# Patient Record
Sex: Male | Born: 1945 | Race: White | Hispanic: No | Marital: Married | Smoking: Never smoker
Health system: Southern US, Community
[De-identification: ages and names within clinical notes are randomized; demographics above are authoritative.]

## PROBLEM LIST (undated history)

## (undated) DIAGNOSIS — C61 Malignant neoplasm of prostate: Secondary | ICD-10-CM

## (undated) DIAGNOSIS — E785 Hyperlipidemia, unspecified: Secondary | ICD-10-CM

## (undated) DIAGNOSIS — I059 Rheumatic mitral valve disease, unspecified: Secondary | ICD-10-CM

## (undated) DIAGNOSIS — I341 Nonrheumatic mitral (valve) prolapse: Secondary | ICD-10-CM

## (undated) DIAGNOSIS — K269 Duodenal ulcer, unspecified as acute or chronic, without hemorrhage or perforation: Secondary | ICD-10-CM

## (undated) DIAGNOSIS — R011 Cardiac murmur, unspecified: Secondary | ICD-10-CM

## (undated) DIAGNOSIS — C801 Malignant (primary) neoplasm, unspecified: Secondary | ICD-10-CM

## (undated) DIAGNOSIS — I10 Essential (primary) hypertension: Secondary | ICD-10-CM

## (undated) DIAGNOSIS — K649 Unspecified hemorrhoids: Secondary | ICD-10-CM

## (undated) DIAGNOSIS — M1990 Unspecified osteoarthritis, unspecified site: Secondary | ICD-10-CM

## (undated) DIAGNOSIS — J309 Allergic rhinitis, unspecified: Secondary | ICD-10-CM

## (undated) DIAGNOSIS — Z8719 Personal history of other diseases of the digestive system: Secondary | ICD-10-CM

## (undated) DIAGNOSIS — Z8711 Personal history of peptic ulcer disease: Secondary | ICD-10-CM

## (undated) DIAGNOSIS — E786 Lipoprotein deficiency: Secondary | ICD-10-CM

## (undated) DIAGNOSIS — R918 Other nonspecific abnormal finding of lung field: Secondary | ICD-10-CM

## (undated) DIAGNOSIS — K219 Gastro-esophageal reflux disease without esophagitis: Secondary | ICD-10-CM

## (undated) HISTORY — PX: CARDIAC CATHETERIZATION: SHX172

## (undated) HISTORY — PX: HERNIA REPAIR: SHX51

## (undated) HISTORY — PX: SALIVARY STONE REMOVAL: SHX5213

---
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
## 2001-04-04 ENCOUNTER — Other Ambulatory Visit: Admission: RE | Admit: 2001-04-04 | Discharge: 2001-04-04 | Payer: Self-pay | Admitting: Otolaryngology

## 2001-04-04 ENCOUNTER — Encounter (INDEPENDENT_AMBULATORY_CARE_PROVIDER_SITE_OTHER): Payer: Self-pay

## 2001-07-20 ENCOUNTER — Encounter: Payer: Self-pay | Admitting: Otolaryngology

## 2001-07-20 ENCOUNTER — Encounter: Admission: RE | Admit: 2001-07-20 | Discharge: 2001-07-20 | Payer: Self-pay | Admitting: Otolaryngology

## 2001-07-29 ENCOUNTER — Other Ambulatory Visit: Admission: RE | Admit: 2001-07-29 | Discharge: 2001-07-29 | Payer: Self-pay | Admitting: Otolaryngology

## 2001-12-02 ENCOUNTER — Ambulatory Visit (HOSPITAL_COMMUNITY): Admission: RE | Admit: 2001-12-02 | Discharge: 2001-12-02 | Payer: Self-pay | Admitting: *Deleted

## 2006-08-13 ENCOUNTER — Encounter (INDEPENDENT_AMBULATORY_CARE_PROVIDER_SITE_OTHER): Payer: Self-pay | Admitting: *Deleted

## 2006-08-13 ENCOUNTER — Ambulatory Visit (HOSPITAL_COMMUNITY): Admission: RE | Admit: 2006-08-13 | Discharge: 2006-08-13 | Payer: Self-pay | Admitting: *Deleted

## 2007-08-25 ENCOUNTER — Encounter (INDEPENDENT_AMBULATORY_CARE_PROVIDER_SITE_OTHER): Payer: Self-pay | Admitting: *Deleted

## 2007-08-25 ENCOUNTER — Ambulatory Visit (HOSPITAL_COMMUNITY): Admission: RE | Admit: 2007-08-25 | Discharge: 2007-08-25 | Payer: Self-pay | Admitting: *Deleted

## 2008-07-20 ENCOUNTER — Encounter: Payer: Self-pay | Admitting: Internal Medicine

## 2008-08-09 ENCOUNTER — Ambulatory Visit: Payer: Self-pay | Admitting: Pulmonary Disease

## 2008-08-09 DIAGNOSIS — K219 Gastro-esophageal reflux disease without esophagitis: Secondary | ICD-10-CM

## 2008-08-09 DIAGNOSIS — E786 Lipoprotein deficiency: Secondary | ICD-10-CM | POA: Insufficient documentation

## 2008-08-09 DIAGNOSIS — R05 Cough: Secondary | ICD-10-CM | POA: Insufficient documentation

## 2008-08-09 DIAGNOSIS — J309 Allergic rhinitis, unspecified: Secondary | ICD-10-CM

## 2008-08-09 DIAGNOSIS — I10 Essential (primary) hypertension: Secondary | ICD-10-CM

## 2008-08-09 DIAGNOSIS — I059 Rheumatic mitral valve disease, unspecified: Secondary | ICD-10-CM | POA: Insufficient documentation

## 2008-08-09 DIAGNOSIS — E785 Hyperlipidemia, unspecified: Secondary | ICD-10-CM

## 2008-08-09 DIAGNOSIS — R059 Cough, unspecified: Secondary | ICD-10-CM | POA: Insufficient documentation

## 2008-08-09 HISTORY — DX: Lipoprotein deficiency: E78.6

## 2008-08-09 HISTORY — DX: Essential (primary) hypertension: I10

## 2008-08-09 HISTORY — DX: Rheumatic mitral valve disease, unspecified: I05.9

## 2008-08-09 HISTORY — DX: Hyperlipidemia, unspecified: E78.5

## 2008-08-09 HISTORY — DX: Gastro-esophageal reflux disease without esophagitis: K21.9

## 2008-08-09 HISTORY — DX: Allergic rhinitis, unspecified: J30.9

## 2008-08-13 ENCOUNTER — Telehealth: Payer: Self-pay | Admitting: Pulmonary Disease

## 2008-09-06 ENCOUNTER — Ambulatory Visit: Payer: Self-pay | Admitting: Pulmonary Disease

## 2008-09-13 ENCOUNTER — Ambulatory Visit (HOSPITAL_COMMUNITY): Admission: RE | Admit: 2008-09-13 | Discharge: 2008-09-13 | Payer: Self-pay | Admitting: Pulmonary Disease

## 2008-12-14 ENCOUNTER — Telehealth: Payer: Self-pay | Admitting: Pulmonary Disease

## 2008-12-25 ENCOUNTER — Telehealth: Payer: Self-pay | Admitting: Pulmonary Disease

## 2008-12-31 ENCOUNTER — Telehealth (INDEPENDENT_AMBULATORY_CARE_PROVIDER_SITE_OTHER): Payer: Self-pay | Admitting: *Deleted

## 2009-01-30 ENCOUNTER — Encounter: Payer: Self-pay | Admitting: Pulmonary Disease

## 2009-03-13 ENCOUNTER — Encounter: Payer: Self-pay | Admitting: Pulmonary Disease

## 2010-03-28 ENCOUNTER — Encounter: Admission: RE | Admit: 2010-03-28 | Discharge: 2010-03-28 | Payer: Self-pay | Admitting: General Surgery

## 2011-04-07 NOTE — Op Note (Signed)
Kenneth Weaver, Kenneth Weaver                ACCOUNT NO.:  [id number]   MEDICAL RECORD NO.:  [id number]          PATIENT TYPE:  AMB   LOCATION:  ENDO                         FACILITY:  [facility]   PHYSICIAN:  Georgiana Spinner, M.D.    DATE OF BIRTH:  December 02, 1945   DATE OF PROCEDURE:  08/25/2007  DATE OF DISCHARGE:                               OPERATIVE REPORT   PROCEDURE:  Upper endoscopy.   INDICATIONS:  Gastroesophageal reflux disease.   ANESTHESIA:  Demerol 70 mg, Versed 7.5 mg.   PROCEDURE:  With the patient mildly sedated in the left lateral  decubitus position the Pentax videoscopic endoscope was inserted and  passed under direct vision through the esophagus which appeared normal.  There was no evidence of Barrett's esophagus.  We entered into the  stomach.  Fundus, body, antrum, duodenal bulb, second portion duodenum  were visualized.  From this point the endoscope was slowly withdrawn  taking circumferential views of duodenal mucosa until the endoscope been  pulled back into the stomach placed in retroflexion to view the stomach  from below and two polyps were seen in the cardia the stomach, both were  photographed and both were sampled for biopsy purposes.  The endoscope  was straightened and withdrawn taking circumferential views remaining  gastric and esophageal mucosa.  The patient's vital signs, pulse  oximeter remained stable.  The patient tolerated procedure well without  apparent complications.   FINDINGS:  Polyps of the fundus and cardia of stomach.  Await biopsy  report.  The patient will call me for results and follow-up with me as  an outpatient.           ______________________________  Georgiana Spinner, M.D.     GMO/MEDQ  D:  08/25/2007  T:  08/26/2007  Job:  [id number]

## 2011-04-10 NOTE — Op Note (Signed)
Kenneth Weaver, MALTOS                ACCOUNT NO.:  [id number]   MEDICAL RECORD NO.:  [id number]          PATIENT TYPE:  AMB   LOCATION:  ENDO                         FACILITY:  [facility]   PHYSICIAN:  Georgiana Spinner, M.D.    DATE OF BIRTH:  1946-06-18   DATE OF PROCEDURE:  08/13/2006  DATE OF DISCHARGE:  08/13/2006                                 OPERATIVE REPORT   PROCEDURE:  Upper endoscopy.   ENDOSCOPIST:  Georgiana Spinner, M.D.   INDICATIONS:  Abdominal discomfort.   ANESTHESIA:  Fentanyl 50 mcg, Versed 5 mg.   DESCRIPTION OF PROCEDURE:  With the patient mildly sedated in the left  lateral decubitus position, the Olympus videoscopic endoscope was inserted  in the mouth, passed under direct vision through the esophagus which  appeared inflamed in the distal most part.  This was photographed and  biopsies taken.  We entered into the stomach.  The fundus, body, antrum,  duodenal bulb, second portion duodenum were visualized.  From this point the  endoscope was slowly withdrawn taking circumferential views of duodenal  mucosa until the endoscope had been pulled back into the stomach, placed in  retroflexion to view the stomach from below.  The endoscope was then  straightened and withdrawn taking circumferential views of the remaining  gastric and esophageal mucosa.  The patient's vital signs, pulse oximeter  remained stable.  The patient tolerated the procedure well without apparent  complications.   FINDINGS:  Distal esophagitis and/or Barrett's esophagus.  Await biopsy  report.  The patient will call me for results and follow up with me as an  outpatient.  Proceed to colonoscopy as planned.           ______________________________  Georgiana Spinner, M.D.     GMO/MEDQ  D:  08/13/2006  T:  08/15/2006  Job:  [id number]

## 2011-04-10 NOTE — Op Note (Signed)
[name], Kenneth Weaver                ACCOUNT NO.:  [id number]   MEDICAL RECORD NO.:  [id number]          PATIENT TYPE:  AMB   LOCATION:  ENDO                         FACILITY:  [facility]   PHYSICIAN:  Georgiana Spinner, M.D.    DATE OF BIRTH:  [birth year]   DATE OF PROCEDURE:  08/13/2006  DATE OF DISCHARGE:                                 OPERATIVE REPORT   PROCEDURE:  Colonoscopy.   INDICATIONS:  Colon cancer screening and hemoccult positivity.   PROCEDURE:  With the patient mildly sedated in the left lateral decubitus  position, a rectal examination was performed which revealed trace positive  material.  The prostate felt normal.  Subsequently, the Olympus videoscopic  colonoscope was inserted in the rectum and passed under direct vision to the  cecum identified by ileocecal valve and appendiceal orifice, both of which  were photographed. From this point, the colonoscope was slowly withdrawn  taking circumferential views of the colonic mucosa stopping only in the  rectum which appeared normal on direct and showed hemorrhoids on retroflexed  view. The endoscope was straightened and withdrawn.  The patient's vital  signs and pulse oximeter remained stable.  The patient tolerated the  procedure well without apparent complications.   FINDINGS:  Internal hemorrhoids, otherwise, unremarkable examination.   PLAN:  Consider repeat examination in 5-10 years           ______________________________  Georgiana Spinner, M.D.     GMO/MEDQ  D:  08/13/2006  T:  08/15/2006  Job:  [id number]

## 2011-04-10 NOTE — Op Note (Signed)
[facility]  Patient:    Kenneth Weaver, Kenneth Weaver Visit Number: [id number] MRN: [id number]          Service Type: Attending:  Sabino Gasser, M.D. Dictated by:   Sabino Gasser, M.D. Proc. Date: 12/02/01                             Operative Report  PROCEDURE:  Colonoscopy.  INDICATIONS:  Rectal bleeding.  ANESTHESIA:  Demerol 80 mg, Versed 8 mg.  DESCRIPTION OF PROCEDURE:  With the patient mildly sedated in the left lateral decubitus position, the Olympus videoscopic colonoscope was inserted in the rectum, passed through a rather tortuous sigmoid colon to the cecum, identified by ileocecal valve and appendiceal orifice, both of which were photographed. From this point, the colonoscope was slowly withdrawn taking circumferential views of the entire colonic mucosa stopping only in the rectum, which appeared normal in direct and showed hemorrhoids in retroflex view. The endoscope was straightened and withdrawn. The patients vital signs and pulse oximeter remained stable. The patient tolerated the procedure well without apparent complications.  FINDINGS:  Tortuous sigmoid colon without significant diverticulosis. Internal hemorrhoids.  PLAN:  Have patient follow up with Dr. Virginia Rochester as needed. Dictated by:   Sabino Gasser, M.D. Attending:  Sabino Gasser, M.D. DD:  12/02/01 TD:  12/02/01 Job: [id number]

## 2012-04-01 ENCOUNTER — Encounter (HOSPITAL_COMMUNITY): Payer: Self-pay | Admitting: *Deleted

## 2012-04-01 ENCOUNTER — Emergency Department (HOSPITAL_COMMUNITY): Payer: Medicare Other

## 2012-04-01 ENCOUNTER — Emergency Department (HOSPITAL_COMMUNITY)
Admission: EM | Admit: 2012-04-01 | Discharge: 2012-04-01 | Disposition: A | Discharge status: Home Health | Payer: Medicare Other | Attending: Emergency Medicine | Admitting: Emergency Medicine

## 2012-04-01 DIAGNOSIS — Z7982 Long term (current) use of aspirin: Secondary | ICD-10-CM | POA: Insufficient documentation

## 2012-04-01 DIAGNOSIS — IMO0002 Reserved for concepts with insufficient information to code with codable children: Secondary | ICD-10-CM | POA: Insufficient documentation

## 2012-04-01 DIAGNOSIS — I10 Essential (primary) hypertension: Secondary | ICD-10-CM | POA: Insufficient documentation

## 2012-04-01 DIAGNOSIS — Y998 Other external cause status: Secondary | ICD-10-CM | POA: Insufficient documentation

## 2012-04-01 DIAGNOSIS — Y92009 Unspecified place in unspecified non-institutional (private) residence as the place of occurrence of the external cause: Secondary | ICD-10-CM | POA: Insufficient documentation

## 2012-04-01 DIAGNOSIS — Y93H2 Activity, gardening and landscaping: Secondary | ICD-10-CM | POA: Insufficient documentation

## 2012-04-01 DIAGNOSIS — S81812A Laceration without foreign body, left lower leg, initial encounter: Secondary | ICD-10-CM

## 2012-04-01 DIAGNOSIS — S81009A Unspecified open wound, unspecified knee, initial encounter: Secondary | ICD-10-CM | POA: Insufficient documentation

## 2012-04-01 HISTORY — DX: Essential (primary) hypertension: I10

## 2012-04-01 MED ORDER — TETANUS-DIPHTH-ACELL PERTUSSIS 5-2.5-18.5 LF-MCG/0.5 IM SUSP
0.5000 mL | Freq: Once | INTRAMUSCULAR | Status: AC
  Administered 2012-04-01: 0.5 mL via INTRAMUSCULAR
  Filled 2012-04-01: qty 0.5

## 2012-04-01 NOTE — Discharge Instructions (Signed)
You were seen and treated for your laceration of leg injury. Your x-rays do not show any large foreign bodies under your skin or other significant injuries. Your wound was cleaned today with irrigation and your wound was closed with sutures. You'll need to have your sutures removed in 7-10 days. Please make an appointment with your primary care provider, an urgent care Center or return to the emergency room to have your sutures removed. Return sooner if you develop any increasing pain, redness of the skin, continued bleeding, drainage, fever, chills or sweats.   Laceration Care, Adult A laceration is a cut or lesion that goes through all layers of the skin and into the tissue just beneath the skin. TREATMENT  Some lacerations may not require closure. Some lacerations may not be able to be closed due to an increased risk of infection. It is important to see your caregiver as soon as possible after an injury to minimize the risk of infection and maximize the opportunity for successful closure. If closure is appropriate, pain medicines may be given, if needed. The wound will be cleaned to help prevent infection. Your caregiver will use stitches (sutures), staples, wound glue (adhesive), or skin adhesive strips to repair the laceration. These tools bring the skin edges together to allow for faster healing and a better cosmetic outcome. However, all wounds will heal with a scar. Once the wound has healed, scarring can be minimized by covering the wound with sunscreen during the day for 1 full year. HOME CARE INSTRUCTIONS  For sutures or staples:  Keep the wound clean and dry.   If you were given a bandage (dressing), you should change it at least once a day. Also, change the dressing if it becomes wet or dirty, or as directed by your caregiver.   Wash the wound with soap and water 2 times a day. Rinse the wound off with water to remove all soap. Pat the wound dry with a clean towel.   After cleaning,  apply a thin layer of the antibiotic ointment as recommended by your caregiver. This will help prevent infection and keep the dressing from sticking.   You may shower as usual after the first 24 hours. Do not soak the wound in water until the sutures are removed.   Only take over-the-counter or prescription medicines for pain, discomfort, or fever as directed by your caregiver.   Get your sutures or staples removed as directed by your caregiver.  For skin adhesive strips:  Keep the wound clean and dry.   Do not get the skin adhesive strips wet. You may bathe carefully, using caution to keep the wound dry.   If the wound gets wet, pat it dry with a clean towel.   Skin adhesive strips will fall off on their own. You may trim the strips as the wound heals. Do not remove skin adhesive strips that are still stuck to the wound. They will fall off in time.  For wound adhesive:  You may briefly wet your wound in the shower or bath. Do not soak or scrub the wound. Do not swim. Avoid periods of heavy perspiration until the skin adhesive has fallen off on its own. After showering or bathing, gently pat the wound dry with a clean towel.   Do not apply liquid medicine, cream medicine, or ointment medicine to your wound while the skin adhesive is in place. This may loosen the film before your wound is healed.   If a dressing is  placed over the wound, be careful not to apply tape directly over the skin adhesive. This may cause the adhesive to be pulled off before the wound is healed.   Avoid prolonged exposure to sunlight or tanning lamps while the skin adhesive is in place. Exposure to ultraviolet light in the first year will darken the scar.   The skin adhesive will usually remain in place for 5 to 10 days, then naturally fall off the skin. Do not pick at the adhesive film.  You may need a tetanus shot if:  You cannot remember when you had your last tetanus shot.   You have never had a tetanus  shot.  If you get a tetanus shot, your arm may swell, get red, and feel warm to the touch. This is common and not a problem. If you need a tetanus shot and you choose not to have one, there is a rare chance of getting tetanus. Sickness from tetanus can be serious. SEEK MEDICAL CARE IF:   You have redness, swelling, or increasing pain in the wound.   You see a red line that goes away from the wound.   You have yellowish-white fluid (pus) coming from the wound.   You have a fever.   You notice a bad smell coming from the wound or dressing.   Your wound breaks open before or after sutures have been removed.   You notice something coming out of the wound such as wood or glass.   Your wound is on your hand or foot and you cannot move a finger or toe.  SEEK IMMEDIATE MEDICAL CARE IF:   Your pain is not controlled with prescribed medicine.   You have severe swelling around the wound causing pain and numbness or a change in color in your arm, hand, leg, or foot.   Your wound splits open and starts bleeding.   You have worsening numbness, weakness, or loss of function of any joint around or beyond the wound.   You develop painful lumps near the wound or on the skin anywhere on your body.  MAKE SURE YOU:   Understand these instructions.   Will watch your condition.   Will get help right away if you are not doing well or get worse.  Document Released: 11/09/2005 Document Revised: 10/29/2011 Document Reviewed: 05/05/2011 [facility] Patient Information 2012 [place], [place].    RESOURCE GUIDE  Dental Problems  Patients with Medicaid: [facility] [phone]                                           [phone] [address] Phone:  [phone]                                                  Phone:  [phone]  If unable to pay or uninsured, contact:  [facility] or [facility]. to become qualified for the adult  dental clinic.  Chronic Pain Problems Contact [facility]  [phone] Patients need to be referred by their primary care doctor.  Insufficient Money for Medicine  Contact United Way:  call "211" or [facility] [employer] [phone].  No Primary Care Doctor Call [facility]  [phone] Other agencies that provide inexpensive medical care    [facility]  [phone]    [facility] Internal Medicine  [phone]    [facility]  [phone]    [facility]  [phone]    [facility]  [phone]    [facility] Child Clinic  [phone]  Psychological [facility] Behavioral Health  [phone] [facility] Services  [phone] [facility]   [phone] (emergency services [phone])  Substance Abuse Resources [facility]  463-262-4216 [facility] 904-855-7896 [facility] [place] 352-239-3202 Floydene Flock 4381248427 Residential & Outpatient Substance Abuse Program  708-325-1829  Abuse/Neglect [facility] Child Abuse Hotline 956-398-5191 [facility] Child Abuse Hotline [phone] (After Hours)  Emergency Shelter [facility] 5030488627  Maternity Homes [facility] 747-027-0636 [facility] [phone]  MRSA Hotline #:   403-236-4833    [facility] Resources  [facility]     [employer]                          [facility] Dept. [address] [place]                       [address]      [address]  Blondell Reveal Phone:  [phone]                                   Phone:  [phone]                 Phone:  [phone]  [facility] Mental Health Phone:  [phone]  [facility] Child Abuse Hotline 857-507-0644 [phone] (After Hours)

## 2012-04-01 NOTE — ED Provider Notes (Signed)
History     CSN: [id number]  Arrival date & time 04/01/12  [place]   First MD Initiated Contact with Patient 04/01/12 2006      Chief Complaint  Patient presents with  . Leg Injury  . Puncture Wound    HPI  History provided by the patient. Patient is a 66 year old male with history of hypertension presents with complaints of lower leg injury and laceration. Patient reports working on the yard when he was cutting signs and had a heavy branches release and hit against his leg. One of the branches had a sharp point because the puncture laceration to the medial aspect near the calf. She reports having heavy bleeding from the wound. Patient states that he is a very easy bleeder. He has no known bleeding disorder. Patient does take a baby aspirin daily but no other blood thinner. Bleeding was controlled with pressure and bandage. he reports wrapping a bandanna around the wound. He denies any numbness or weakness in the foot. Patient is unsure of his last tetanus shot. Patient has no other complaints.    Past Medical History  Diagnosis Date  . Hypertension     Past Surgical History  Procedure Date  . Hernia repair   . Salivary stone removal     No family history on file.  History  Substance Use Topics  . Smoking status: Never Smoker   . Smokeless tobacco: Not on file  . Alcohol Use: No      Review of Systems  Gastrointestinal: Negative for nausea.  Musculoskeletal: Negative for joint swelling.  Skin:       Laceration  Neurological: Negative for weakness, light-headedness and numbness.    Allergies  Calcium channel blockers and Ramipril  Home Medications   Current Outpatient Rx  Name Route Sig Dispense Refill  . VITAMIN D 1000 UNITS PO TABS Oral Take 2,000 Units by mouth daily.    Marland Kitchen CINNAMON 500 MG PO TABS Oral Take 1 tablet by mouth daily.    Marland Kitchen OLMESARTAN MEDOXOMIL 20 MG PO TABS Oral Take 20 mg by mouth daily.    . OMEGA-3-ACID ETHYL ESTERS 1 G PO CAPS Oral Take 2 g  by mouth 2 (two) times daily.      BP 144/80  Pulse 106  Temp(Src) 98.1 F (36.7 C) (Oral)  Resp 17  SpO2 96%  Physical Exam  Nursing note and vitals reviewed. Constitutional: He is oriented to person, place, and time. He appears well-developed and well-nourished. No distress.  HENT:  Head: Normocephalic and atraumatic.  Cardiovascular: Normal rate and regular rhythm.   Pulmonary/Chest: Effort normal and breath sounds normal. No respiratory distress. He has no wheezes. He has no rales.  Abdominal: Soft.  Musculoskeletal:       1 cm puncture laceration to the medial aspect of left lower leg. There is not appear to be any deeper structure involvement through full range of motion. There is some exposed subcutaneous fat.  Normal dorsal pedal pulses, movement and feet and sensations.  Neurological: He is alert and oriented to person, place, and time.  Skin: Skin is warm.  Psychiatric: He has a normal mood and affect. His behavior is normal.    ED Course  Procedures   LACERATION REPAIR Performed by: Angus Seller Authorized by: Angus Seller Consent: Verbal consent obtained. Risks and benefits: risks, benefits and alternatives were discussed Consent given by: patient Patient identity confirmed: provided demographic data Prepped and Draped in normal sterile fashion Wound explored  Laceration  Location: Left medial lower leg  Laceration Length: 1 cm  No Foreign Bodies seen or palpated  Anesthesia: local infiltration  Local anesthetic: lidocaine 2% with epinephrine   Anesthetic total: 4 ml  Irrigation method: syringe Amount of cleaning: standard  Skin closure: Skin with 3-0 Prolene   Number of sutures: 3   Technique: 2 horizontal mattress and one simple interrupted   Patient tolerance: Patient tolerated the procedure well with no immediate complications.     Dg Tibia/fibula Left  04/01/2012  *RADIOLOGY REPORT*  Clinical Data: Leg injury, puncture wound  LEFT  TIBIA AND FIBULA - 2 VIEW  Comparison: None.  Findings: There is soft tissue injury to the calf muscle.  No radiodense foreign body.  No osseous abnormality.  IMPRESSION: No fracture.  No radiodense foreign body.  Soft tissue injury.  Original Report Authenticated By: Genevive Bi, M.D.     1. Laceration of left lower leg       MDM  Patient seen and evaluated. Patient no acute distress. Patient with small laceration.        Angus Seller, [place] 04/01/12 2148

## 2012-04-01 NOTE — ED Notes (Signed)
Pt was working in the yard and a stick came down off the tree and hit him in the leg.  Pt has puncture wound to left calf.  Pt reports bleeding was initially uncontrolled and he applied a tunicate.  Bleeding controlled upon arrival. Tunicate removed and pressure dressing applied.  Pt able to ambulate.  Pt denies feeling weak. Pt foot is pink, warm to touch and dorsal pedal pulse noted.

## 2012-04-02 NOTE — ED Provider Notes (Signed)
Medical screening examination/treatment/procedure(s) were performed by non-physician practitioner and as supervising physician I was immediately available for consultation/collaboration. Devoria Albe, MD, Armando Gang   Ward Givens, MD 04/02/12 [phone]

## 2013-07-18 ENCOUNTER — Other Ambulatory Visit: Payer: Self-pay | Admitting: Internal Medicine

## 2013-07-18 DIAGNOSIS — R0989 Other specified symptoms and signs involving the circulatory and respiratory systems: Secondary | ICD-10-CM

## 2013-08-04 ENCOUNTER — Ambulatory Visit
Admission: RE | Admit: 2013-08-04 | Discharge: 2013-08-04 | Disposition: A | Discharge status: Home / Self Care | Payer: Medicare Other | Source: Ambulatory Visit | Attending: Internal Medicine | Admitting: Internal Medicine

## 2013-08-04 DIAGNOSIS — R0989 Other specified symptoms and signs involving the circulatory and respiratory systems: Secondary | ICD-10-CM

## 2014-12-17 ENCOUNTER — Other Ambulatory Visit: Payer: Self-pay | Admitting: Urology

## 2015-01-15 NOTE — Patient Instructions (Addendum)
19 Kenneth Weaver  01/15/2015   Your procedure is scheduled on:   01-24-2015 Thursday  Enter through [facility]  Entrance and follow signs to [facility]. Arrive at    0830    AM. Call this number if you have problems the morning of surgery: [phone]  Or Presurgical Testing [phone].   For Living Will and/or Health Care Power Attorney Forms: please provide copy for your medical record,may bring AM of surgery(Forms should be already notarized -we do not provide this service).(Yes/ No information preferred today).  Remember: Follow any bowel prep instructions per MD office. For Cpap use: Bring mask and tubing only.   Do not eat food/ or drink: After Midnight.      Take these medicines the morning of surgery with A SIP OF WATER: Bring/use Flonase Spray(if needed).   Do not wear jewelry, make-up or nail polish.  Do not wear deodorant, lotions, powders, or perfumes.   Do not shave legs and under arms- 48 hours(2 days) prior to first CHG shower.(Shaving face and neck okay.)  Do not bring valuables to the hospital.(Hospital is not responsible for lost valuables).  Contacts, dentures or removable bridgework, body piercing, hair pins may not be worn into surgery.  Leave suitcase in the car. After surgery it may be brought to your room.  For patients admitted to the hospital, checkout time is 11:00 AM the day of discharge.(Restricted visitors-Any Persons displaying flu-like symptoms or illness).    Patients discharged the day of surgery will not be allowed to drive home. Must have responsible person with you x 24 hours once discharged.  Name and phone number of your driver:son- [name]  [phone] cell    Please read over the following fact sheets that you were given:  CHG(Chlorhexidine Gluconate 4% Surgical Soap) use, MRSA Information, Blood Transfusion fact sheet, Incentive Spirometry Instruction.  Remember : Type/Screen "Blue armbands" - may not be removed once  applied(would result in being retested AM of surgery, if removed).         [facility] - Preparing for Surgery Before surgery, you can play an important role.  Because skin is not sterile, your skin needs to be as free of germs as possible.  You can reduce the number of germs on your skin by washing with CHG (chlorahexidine gluconate) soap before surgery.  CHG is an antiseptic cleaner which kills germs and bonds with the skin to continue killing germs even after washing. Please DO NOT use if you have an allergy to CHG or antibacterial soaps.  If your skin becomes reddened/irritated stop using the CHG and inform your nurse when you arrive at Short Stay. Do not shave (including legs and underarms) for at least 48 hours prior to the first CHG shower.  You may shave your face/neck. Please follow these instructions carefully:  1.  Shower with CHG Soap the night before surgery and the  morning of Surgery.  2.  If you choose to wash your hair, wash your hair first as usual with your  normal  shampoo.  3.  After you shampoo, rinse your hair and body thoroughly to remove the  shampoo.                           4.  Use CHG as you would any other liquid soap.  You can apply chg directly  to the skin and wash  Gently with a scrungie or clean washcloth.  5.  Apply the CHG Soap to your body ONLY FROM THE NECK DOWN.   Do not use on face/ open                           Wound or open sores. Avoid contact with eyes, ears mouth and genitals (private parts).                       Wash face,  Genitals (private parts) with your normal soap.             6.  Wash thoroughly, paying special attention to the area where your surgery  will be performed.  7.  Thoroughly rinse your body with warm water from the neck down.  8.  DO NOT shower/wash with your normal soap after using and rinsing off  the CHG Soap.                9.  Pat yourself dry with a clean towel.            10.  Wear clean pajamas.             11.  Place clean sheets on your bed the night of your first shower and do not  sleep with pets. Day of Surgery : Do not apply any lotions/deodorants the morning of surgery.  Please wear clean clothes to the hospital/surgery center.  FAILURE TO FOLLOW THESE INSTRUCTIONS MAY RESULT IN THE CANCELLATION OF YOUR SURGERY PATIENT SIGNATURE_________________________________  NURSE SIGNATURE__________________________________  ________________________________________________________________________   Adam Phenix  An incentive spirometer is a tool that can help keep your lungs clear and active. This tool measures how well you are filling your lungs with each breath. Taking long deep breaths may help reverse or decrease the chance of developing breathing (pulmonary) problems (especially infection) following:  A long period of time when you are unable to move or be active. BEFORE THE PROCEDURE   If the spirometer includes an indicator to show your best effort, your nurse or respiratory therapist will set it to a desired goal.  If possible, sit up straight or lean slightly forward. Try not to slouch.  Hold the incentive spirometer in an upright position. INSTRUCTIONS FOR USE   Sit on the edge of your bed if possible, or sit up as far as you can in bed or on a chair.  Hold the incentive spirometer in an upright position.  Breathe out normally.  Place the mouthpiece in your mouth and seal your lips tightly around it.  Breathe in slowly and as deeply as possible, raising the piston or the ball toward the top of the column.  Hold your breath for 3-5 seconds or for as long as possible. Allow the piston or ball to fall to the bottom of the column.  Remove the mouthpiece from your mouth and breathe out normally.  Rest for a few seconds and repeat Steps 1 through 7 at least 10 times every 1-2 hours when you are awake. Take your time and take a few normal breaths between deep  breaths.  The spirometer may include an indicator to show your best effort. Use the indicator as a goal to work toward during each repetition.  After each set of 10 deep breaths, practice coughing to be sure your lungs are clear. If you have an incision (the cut made at the time of  surgery), support your incision when coughing by placing a pillow or rolled up towels firmly against it. Once you are able to get out of bed, walk around indoors and cough well. You may stop using the incentive spirometer when instructed by your caregiver.  RISKS AND COMPLICATIONS  Take your time so you do not get dizzy or light-headed.  If you are in pain, you may need to take or ask for pain medication before doing incentive spirometry. It is harder to take a deep breath if you are having pain. AFTER USE  Rest and breathe slowly and easily.  It can be helpful to keep track of a log of your progress. Your caregiver can provide you with a simple table to help with this. If you are using the spirometer at home, follow these instructions: [place] IF:   You are having difficultly using the spirometer.  You have trouble using the spirometer as often as instructed.  Your pain medication is not giving enough relief while using the spirometer.  You develop fever of 100.5 F (38.1 C) or higher. SEEK IMMEDIATE MEDICAL CARE IF:   You cough up bloody sputum that had not been present before.  You develop fever of 102 F (38.9 C) or greater.  You develop worsening pain at or near the incision site. MAKE SURE YOU:   Understand these instructions.  Will watch your condition.  Will get help right away if you are not doing well or get worse. Document Released: 03/22/2007 Document Revised: 02/01/2012 Document Reviewed: 05/23/2007 ExitCare Patient Information 2014 Exit[employer], [place].   ________________________________________________________________________  WHAT IS A BLOOD TRANSFUSION? Blood  Transfusion Information  A transfusion is the replacement of blood or some of its parts. Blood is made up of multiple cells which provide different functions.  Red blood cells carry oxygen and are used for blood loss replacement.  White blood cells fight against infection.  Platelets control bleeding.  Plasma helps clot blood.  Other blood products are available for specialized needs, such as hemophilia or other clotting disorders. BEFORE THE TRANSFUSION  Who gives blood for transfusions?   Healthy volunteers who are fully evaluated to make sure their blood is safe. This is blood bank blood. Transfusion therapy is the safest it has ever been in the practice of medicine. Before blood is taken from a donor, a complete history is taken to make sure that person has no history of diseases nor engages in risky social behavior (examples are intravenous drug use or sexual activity with multiple partners). The donor's travel history is screened to minimize risk of transmitting infections, such as malaria. The donated blood is tested for signs of infectious diseases, such as HIV and hepatitis. The blood is then tested to be sure it is compatible with you in order to minimize the chance of a transfusion reaction. If you or a relative donates blood, this is often done in anticipation of surgery and is not appropriate for emergency situations. It takes many days to process the donated blood. RISKS AND COMPLICATIONS Although transfusion therapy is very safe and saves many lives, the main dangers of transfusion include:   Getting an infectious disease.  Developing a transfusion reaction. This is an allergic reaction to something in the blood you were given. Every precaution is taken to prevent this. The decision to have a blood transfusion has been considered carefully by your caregiver before blood is given. Blood is not given unless the benefits outweigh the risks. AFTER THE TRANSFUSION  Right after  receiving a blood transfusion, you will usually feel much better and more energetic. This is especially true if your red blood cells have gotten low (anemic). The transfusion raises the level of the red blood cells which carry oxygen, and this usually causes an energy increase.  The nurse administering the transfusion will monitor you carefully for complications. HOME CARE INSTRUCTIONS  No special instructions are needed after a transfusion. You may find your energy is better. Speak with your caregiver about any limitations on activity for underlying diseases you may have. SEEK MEDICAL CARE IF:   Your condition is not improving after your transfusion.  You develop redness or irritation at the intravenous (IV) site. SEEK IMMEDIATE MEDICAL CARE IF:  Any of the following symptoms occur over the next 12 hours:  Shaking chills.  You have a temperature by mouth above 102 F (38.9 C), not controlled by medicine.  Chest, back, or muscle pain.  People around you feel you are not acting correctly or are confused.  Shortness of breath or difficulty breathing.  Dizziness and fainting.  You get a rash or develop hives.  You have a decrease in urine output.  Your urine turns a dark color or changes to pink, red, or brown. Any of the following symptoms occur over the next 10 days:  You have a temperature by mouth above 102 F (38.9 C), not controlled by medicine.  Shortness of breath.  Weakness after normal activity.  The white part of the eye turns yellow (jaundice).  You have a decrease in the amount of urine or are urinating less often.  Your urine turns a dark color or changes to pink, red, or brown. Document Released: 11/06/2000 Document Revised: 02/01/2012 Document Reviewed: 06/25/2008 [facility] Patient Information 2014 [place], [place].  _______________________________________________________________________

## 2015-01-16 ENCOUNTER — Ambulatory Visit (HOSPITAL_COMMUNITY)
Admission: RE | Admit: 2015-01-16 | Discharge: 2015-01-16 | Disposition: A | Discharge status: Home / Self Care | Payer: Medicare Other | Source: Ambulatory Visit | Attending: Urology | Admitting: Urology

## 2015-01-16 ENCOUNTER — Encounter (HOSPITAL_COMMUNITY)
Admission: RE | Admit: 2015-01-16 | Discharge: 2015-01-16 | Disposition: A | Discharge status: Home / Self Care | Payer: Medicare Other | Source: Ambulatory Visit | Attending: Urology | Admitting: Urology

## 2015-01-16 ENCOUNTER — Encounter (HOSPITAL_COMMUNITY): Payer: Self-pay

## 2015-01-16 DIAGNOSIS — Z01812 Encounter for preprocedural laboratory examination: Secondary | ICD-10-CM | POA: Insufficient documentation

## 2015-01-16 DIAGNOSIS — I10 Essential (primary) hypertension: Secondary | ICD-10-CM

## 2015-01-16 DIAGNOSIS — C61 Malignant neoplasm of prostate: Secondary | ICD-10-CM | POA: Diagnosis not present

## 2015-01-16 DIAGNOSIS — Z01818 Encounter for other preprocedural examination: Secondary | ICD-10-CM | POA: Insufficient documentation

## 2015-01-16 DIAGNOSIS — E785 Hyperlipidemia, unspecified: Secondary | ICD-10-CM | POA: Diagnosis not present

## 2015-01-16 HISTORY — DX: Cardiac murmur, unspecified: R01.1

## 2015-01-16 HISTORY — DX: Malignant neoplasm of prostate: C61

## 2015-01-16 HISTORY — DX: Duodenal ulcer, unspecified as acute or chronic, without hemorrhage or perforation: K26.9

## 2015-01-16 HISTORY — DX: Malignant (primary) neoplasm, unspecified: C80.1

## 2015-01-16 HISTORY — DX: Unspecified osteoarthritis, unspecified site: M19.90

## 2015-01-16 HISTORY — DX: Unspecified hemorrhoids: K64.9

## 2015-01-16 LAB — CBC
HEMATOCRIT: 44.1 % (ref 39.0–52.0)
Hemoglobin: 14.4 g/dL (ref 13.0–17.0)
MCH: 29.6 pg (ref 26.0–34.0)
MCHC: 32.7 g/dL (ref 30.0–36.0)
MCV: 90.6 fL (ref 78.0–100.0)
Platelets: 286 10*3/uL (ref 150–400)
RBC: 4.87 MIL/uL (ref 4.22–5.81)
RDW: 12.5 % (ref 11.5–15.5)
WBC: 4.5 10*3/uL (ref 4.0–10.5)

## 2015-01-16 LAB — BASIC METABOLIC PANEL
Anion gap: 7 (ref 5–15)
BUN: 14 mg/dL (ref 6–23)
CHLORIDE: 97 mmol/L (ref 96–112)
CO2: 32 mmol/L (ref 19–32)
CREATININE: 0.72 mg/dL (ref 0.50–1.35)
Calcium: 9.6 mg/dL (ref 8.4–10.5)
GFR calc Af Amer: >90 mL/min (ref >90)
GFR calc non Af Amer: >90 mL/min (ref >90)
Glucose, Bld: 119 mg/dL — ABNORMAL HIGH (ref 70–99)
Potassium: 4.5 mmol/L (ref 3.5–5.1)
Sodium: 136 mmol/L (ref 135–145)

## 2015-01-16 LAB — ABO/RH: ABO/RH(D): O NEG

## 2015-01-16 NOTE — Pre-Procedure Instructions (Addendum)
EKG 10'15, Echo 8'14 Reports with chart. LOV note 10'15(Dr. MacKenzie,PCP) with chart. CXR done today.

## 2015-01-17 NOTE — Progress Notes (Signed)
CXR done 01/16/15 faxed via EPIC to Dr Alinda Money.

## 2015-01-23 NOTE — H&P (Signed)
Chief Complaint Prostate Cancer   Reason For Visit Reason for consult: To discuss treatment options for prostate cancer and specifically to consider a robotic prostatectomy.  Physician requesting consult: Dr. Franchot Gallo  PCP: Dr. 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 Diona Fanti to perform a prostate needle biopsy on 11/08/14. This confirmed Gleason 4+3=7 adenocarcinoma of the prostate with 5 out of 12 biopsy cores positive for malignancy. He has no family history of prostate cancer. His father did have bladder cancer. His does have longevity in his family with his father living to age 13 and his mother living to age 52. He has been thoroughly counseled about his treatment options by Dr. Diona Fanti and is most interested in surgical treatment.    ** His PSH is significant for a prior right laparoscopic hernia repair by Dr. Ronnald Collum around 2010.    TNM stage: cT2a Nx Mx (R apical nodule noted on my exam today)  PSA: 6.4  Gleason score: 4+3=7  Biopsy (11/08/14): 5/12 cores positive    Left: L lateral apex (50%, 3+4=7, PNI), L apex (30%, 4+3=7)    Right: R lateral apex (70%, 3+3=6), R lateral mid (40%, 3+3=6), R lateral base (10%, 3+3=6)  Prostate volume: 44 cc    Nomogram  OC disease: 32%  EPE: 65%  SVI: 9%  LNI: 9%  PFS (surgery): 65% at 4 years, 50% at 9 years    Urinary function: He denies LUTS. IPSS is 1.  Erectile function: He denies erectile dysfunction. SHIM score is 25.   Past Medical History Problems  1. History of arthritis (Z87.39) 2. History of gastric ulcer (Z87.19) 3. History of hypertension (Z86.79) 4. History of mitral valve prolapse (Z86.79)  Surgical History Problems  1. History of Hernia Repair 2. History of Oral Surgery Tooth Extraction 3. History of Surgery Of Salivary Glands And Ducts  Current Meds 1.  Acetaminophen 500 MG Oral Tablet; TAKE TABLET  PRN;  Therapy: (Recorded:23Nov2015) to Recorded 2. Aspirin 81 MG Oral Tablet;  Therapy: (Recorded:23Nov2015) to Recorded 3. Benicar 40 MG Oral Tablet; TAKE 1/2 TABLET DAILY;  Therapy: (Recorded:23Nov2015) to Recorded 4. Calcium + D TABS;  Therapy: (Recorded:23Nov2015) to Recorded 5. Chlor-Tablets TABS; TAKE TABLET  PRN;  Therapy: (Recorded:23Nov2015) to Recorded 6. Cinnamon CAPS;  Therapy: (Recorded:23Nov2015) to Recorded 7. Fiber Therapy TABS;  Therapy: (Recorded:23Nov2015) to Recorded 8. Fish Oil 1000 MG Oral Capsule;  Therapy: (Recorded:23Nov2015) to Recorded 9. Fluticasone Propionate 50 MCG/ACT Nasal Suspension;  Therapy: (Recorded:23Nov2015) to Recorded 10. Glucosamine HCl TABS;   Therapy: (Recorded:23Nov2015) to Recorded 11. Hydrochlorothiazide 50 MG Oral Tablet; take 1/2 tablet daily;   Therapy: (Recorded:23Nov2015) to Recorded 12. Hydrocortisone Butyrate 0.1 % External Ointment;   Therapy: (Recorded:23Nov2015) to Recorded 13. Levofloxacin 500 MG Oral Tablet; One tablet the day before procedure, the day of   procedure, and the day after procedure;   Therapy: [id number] to (Last Rx:23Nov2015)  Requested for: 23Nov2015 Ordered 14. Magnesium TABS;   Therapy: (Recorded:23Nov2015) to Recorded 15. Vitamin B-12 500 MCG Oral Tablet;   Therapy: (Recorded:23Nov2015) to Recorded 16. Vitamin D3 2000 UNIT Oral Tablet;   Therapy: (Recorded:23Nov2015) to Recorded  Allergies Medication  1. Altace CAPS 2. CeleBREX CAPS 3. Crestor TABS 4. diltiazem 5. losartan 6. pravastatin 7. Reglan TABS 8. Tetracyclines  His adverse reactions Celebrex was GI upset.   Family History Problems  1. Family history of cardiac disorder (  Z82.49) : Father 2. Family history of cerebrovascular accident (CVA) (Z82.3) : Mother 3. Family history of malignant neoplasm of urinary bladder (Z80.52) : Father  Social History Problems    Denied: History of  Alcohol use   Denied: History of Caffeine use   Married   Never a smoker   Retired  Review of Systems Constitutional, skin, eye, otolaryngeal, hematologic/lymphatic, cardiovascular, pulmonary, endocrine, musculoskeletal, gastrointestinal, neurological and psychiatric system(s) were reviewed and pertinent findings if present are noted and are otherwise negative.    Vitals Vital Signs [Data Includes: Last 1 Day]  Recorded: [id number] 12:58PM  Height: 6 ft  Weight: 140 lb  BMI Calculated: 18.99 BSA Calculated: 1.83 Blood Pressure: 157 / 73 Heart Rate: 71  Physical Exam Constitutional: Well nourished and well developed . No acute distress.  ENT:. The ears and nose are normal in appearance.  Neck: The appearance of the neck is normal and no neck mass is present.  Pulmonary: No respiratory distress, normal respiratory rhythm and effort and clear bilateral breath sounds.  Cardiovascular: Heart rate and rhythm are normal . No peripheral edema.  Abdomen: periumbilical incision site(s) well healed. The abdomen is soft and nontender. No masses are palpated. No CVA tenderness. No hernias are palpable. No hepatosplenomegaly noted.  Rectal: Rectal exam demonstrates normal sphincter tone, no tenderness and no masses. Prostate size is estimated to be 35 g. He has a small confined right apical prostate nodule. The prostate is not tender. The left seminal vesicle is nonpalpable. The right seminal vesicle is nonpalpable. The perineum is normal on inspection.  Lymphatics: The femoral and inguinal nodes are not enlarged or tender.  Skin: Normal skin turgor, no visible rash and no visible skin lesions.  Neuro/Psych:. Mood and affect are appropriate.    Results/Data Selected Results  UA With REFLEX [id number] 12:46PM Raynelle Bring  SPECIMEN TYPE: CLEAN CATCH   Test Name Result Flag Reference  COLOR YELLOW  YELLOW  APPEARANCE CLEAR  CLEAR  SPECIFIC GRAVITY 1.010  1.005-1.030  pH 6.5  5.0-8.0   GLUCOSE 250 mg/dL A NEG  BILIRUBIN NEG  NEG  KETONE NEG mg/dL  NEG  BLOOD NEG  NEG  PROTEIN NEG mg/dL  NEG  UROBILINOGEN 0.2 mg/dL  0.0-1.0  NITRITE NEG  NEG  LEUKOCYTE ESTERASE NEG  NEG   Assessment Assessed  1. Adenocarcinoma of prostate (C61)  Plan Adenocarcinoma of prostate  1. Follow-up Keep Future Appt Office  Follow-up  Status: Hold For - Appointment  Requested  for: [id number]  Discussion/Summary 1. Prostate cancer: I had a detailed discussion with Kenneth Weaver and his wife today regarding his prostate cancer and treatment options.   The patient was counseled about the natural history of prostate cancer and the standard treatment options that are available for prostate cancer. It was explained to him how his age and life expectancy, clinical stage, Gleason score, and PSA affect his prognosis, the decision to proceed with additional staging studies, as well as how that information influences recommended treatment strategies. We discussed the roles for active surveillance, radiation therapy, surgical therapy, androgen deprivation, as well as ablative therapy options for the treatment of prostate cancer as appropriate to his individual cancer situation. We discussed the risks and benefits of these options with regard to their impact on cancer control and also in terms of potential adverse events, complications, and impact on quiality of life particularly related to urinary, bowel, and sexual function. The patient was encouraged to ask questions throughout the discussion today and  all questions were answered to his stated satisfaction. In addition, the patient was provided with and/or directed to appropriate resources and literature for further education about prostate cancer and treatment options.   We discussed surgical therapy for prostate cancer including the different available surgical approaches. We discussed, in detail, the risks and expectations of surgery with regard to cancer  control, urinary control, and erectile function as well as the expected postoperative recovery process. Additional risks of surgery including but not limited to bleeding, infection, hernia formation, nerve damage, lymphocele formation, bowel/rectal injury potentially necessitating colostomy, damage to the urinary tract resulting in urine leakage, urethral stricture, and the cardiopulmonary risks such as myocardial infarction, stroke, death, venothromboembolism, etc. were explained. The risk of open surgical conversion for robotic/laparoscopic prostatectomy was also discussed.   He does wish to proceed with surgical treatment and will undergo a bilateral nerve sparing robotic-assisted laparoscopic radical prostatectomy and bilateral pelvic lymphadenectomy. I will pay closer attention to the right apex were does have a small palpable abnormality today.    Cc: Dr. Franchot Gallo  Dr. Trilby Drummer  A total of 55 minutes were spent in the overall care of the patient today with 45 minutes in direct face to face consultation.    Signatures Electronically signed by : Raynelle Bring, M.D.; Dec 25 2014  2:14PM EST  Documentation I spoke with Kenneth Weaver regarding his chest x-ray and subsequent CT scan of the chest results. I did independently reviewed these tests and also talked with Dr. Christinia Gully with pulmonology. Dr. Melvyn Novas felt that Kenneth Weaver could safely proceed with his radical prostatectomy later this week with plans to follow up for outpatient pulmonology evaluation on a nonurgent basis following recovery from his surgery. We will plan to arrange this for the future.   Signatures Electronically signed by : Raynelle Bring, M.D.; Jan 22 2015  9:43AM EST

## 2015-01-24 ENCOUNTER — Inpatient Hospital Stay (HOSPITAL_COMMUNITY): Payer: Medicare Other | Admitting: Anesthesiology

## 2015-01-24 ENCOUNTER — Inpatient Hospital Stay (HOSPITAL_COMMUNITY)
Admission: RE | Admit: 2015-01-24 | Discharge: 2015-01-25 | DRG: 708 | Disposition: A | Discharge status: Home / Self Care | Payer: Medicare Other | Source: Ambulatory Visit | Attending: Urology | Admitting: Urology

## 2015-01-24 ENCOUNTER — Encounter (HOSPITAL_COMMUNITY)
Admission: RE | Disposition: A | Discharge status: Home / Self Care | Payer: Self-pay | Source: Ambulatory Visit | Attending: Urology

## 2015-01-24 ENCOUNTER — Encounter (HOSPITAL_COMMUNITY): Payer: Self-pay | Admitting: *Deleted

## 2015-01-24 DIAGNOSIS — C61 Malignant neoplasm of prostate: Secondary | ICD-10-CM

## 2015-01-24 DIAGNOSIS — Z8711 Personal history of peptic ulcer disease: Secondary | ICD-10-CM

## 2015-01-24 DIAGNOSIS — Z881 Allergy status to other antibiotic agents status: Secondary | ICD-10-CM | POA: Diagnosis not present

## 2015-01-24 DIAGNOSIS — I10 Essential (primary) hypertension: Secondary | ICD-10-CM | POA: Diagnosis present

## 2015-01-24 DIAGNOSIS — Z7982 Long term (current) use of aspirin: Secondary | ICD-10-CM

## 2015-01-24 DIAGNOSIS — M1990 Unspecified osteoarthritis, unspecified site: Secondary | ICD-10-CM | POA: Diagnosis present

## 2015-01-24 DIAGNOSIS — Z79899 Other long term (current) drug therapy: Secondary | ICD-10-CM

## 2015-01-24 DIAGNOSIS — Z888 Allergy status to other drugs, medicaments and biological substances status: Secondary | ICD-10-CM | POA: Diagnosis not present

## 2015-01-24 DIAGNOSIS — I341 Nonrheumatic mitral (valve) prolapse: Secondary | ICD-10-CM | POA: Diagnosis present

## 2015-01-24 HISTORY — PX: ROBOT ASSISTED LAPAROSCOPIC RADICAL PROSTATECTOMY: SHX5141

## 2015-01-24 HISTORY — PX: PROSTATE BIOPSY: SHX241

## 2015-01-24 HISTORY — DX: Malignant neoplasm of prostate: C61

## 2015-01-24 HISTORY — PX: LYMPHADENECTOMY: SHX5960

## 2015-01-24 LAB — TYPE AND SCREEN
ABO/RH(D): O NEG
ANTIBODY SCREEN: NEGATIVE

## 2015-01-24 LAB — HEMOGLOBIN AND HEMATOCRIT, BLOOD
HCT: 37.3 % — ABNORMAL LOW (ref 39.0–52.0)
Hemoglobin: 12.7 g/dL — ABNORMAL LOW (ref 13.0–17.0)

## 2015-01-24 SURGERY — ROBOTIC ASSISTED LAPAROSCOPIC RADICAL PROSTATECTOMY LEVEL 3
Anesthesia: General | Site: Abdomen

## 2015-01-24 MED ORDER — DEXAMETHASONE SODIUM PHOSPHATE 10 MG/ML IJ SOLN
INTRAMUSCULAR | Status: DC | PRN
  Administered 2015-01-24: 10 mg via INTRAVENOUS

## 2015-01-24 MED ORDER — PROPOFOL 10 MG/ML IV BOLUS
INTRAVENOUS | Status: AC
  Filled 2015-01-24: qty 20

## 2015-01-24 MED ORDER — LACTATED RINGERS IV SOLN
INTRAVENOUS | Status: DC | PRN
  Administered 2015-01-24: 1000 mL

## 2015-01-24 MED ORDER — CIPROFLOXACIN HCL 500 MG PO TABS: 500.0000 mg | ORAL_TABLET | Freq: Two times a day (BID) | ORAL | Status: DC

## 2015-01-24 MED ORDER — GLYCOPYRROLATE 0.2 MG/ML IJ SOLN
INTRAMUSCULAR | Status: AC
  Filled 2015-01-24: qty 3

## 2015-01-24 MED ORDER — MIDAZOLAM HCL 5 MG/5ML IJ SOLN
INTRAMUSCULAR | Status: DC | PRN
  Administered 2015-01-24: 2 mg via INTRAVENOUS

## 2015-01-24 MED ORDER — ONDANSETRON HCL 4 MG/2ML IJ SOLN
INTRAMUSCULAR | Status: AC
  Filled 2015-01-24: qty 2

## 2015-01-24 MED ORDER — HYDROCODONE-ACETAMINOPHEN 5-325 MG PO TABS: 1.0000 | ORAL_TABLET | Freq: Four times a day (QID) | ORAL | Status: DC | PRN

## 2015-01-24 MED ORDER — NEOSTIGMINE METHYLSULFATE 10 MG/10ML IV SOLN
INTRAVENOUS | Status: DC | PRN
  Administered 2015-01-24: 4 mg via INTRAVENOUS

## 2015-01-24 MED ORDER — HYDROMORPHONE HCL 1 MG/ML IJ SOLN
INTRAMUSCULAR | Status: DC | PRN
  Administered 2015-01-24: 1 mg via INTRAVENOUS
  Administered 2015-01-24 (×2): 0.5 mg via INTRAVENOUS

## 2015-01-24 MED ORDER — LACTATED RINGERS IV SOLN
INTRAVENOUS | Status: DC
Start: 2015-01-24 — End: 2015-01-24
  Administered 2015-01-24: 14:00:00 via INTRAVENOUS
  Administered 2015-01-24: 1000 mL via INTRAVENOUS
  Administered 2015-01-24: 13:00:00 via INTRAVENOUS

## 2015-01-24 MED ORDER — MORPHINE SULFATE 10 MG/ML IJ SOLN
2.0000 mg | INTRAMUSCULAR | Status: DC | PRN
  Administered 2015-01-25: 2 mg via INTRAVENOUS
  Filled 2015-01-24: qty 1

## 2015-01-24 MED ORDER — BUPIVACAINE-EPINEPHRINE (PF) 0.25% -1:200000 IJ SOLN
INTRAMUSCULAR | Status: AC
  Filled 2015-01-24: qty 30

## 2015-01-24 MED ORDER — MIDAZOLAM HCL 2 MG/2ML IJ SOLN
INTRAMUSCULAR | Status: AC
  Filled 2015-01-24: qty 2

## 2015-01-24 MED ORDER — LIDOCAINE HCL (CARDIAC) 20 MG/ML IV SOLN
INTRAVENOUS | Status: DC | PRN
  Administered 2015-01-24: 50 mg via INTRAVENOUS

## 2015-01-24 MED ORDER — LIDOCAINE HCL (CARDIAC) 20 MG/ML IV SOLN
INTRAVENOUS | Status: AC
  Filled 2015-01-24: qty 5

## 2015-01-24 MED ORDER — SUCCINYLCHOLINE CHLORIDE 20 MG/ML IJ SOLN
INTRAMUSCULAR | Status: DC | PRN
  Administered 2015-01-24: 100 mg via INTRAVENOUS

## 2015-01-24 MED ORDER — FENTANYL CITRATE 0.05 MG/ML IJ SOLN
INTRAMUSCULAR | Status: DC | PRN
  Administered 2015-01-24 (×3): 50 ug via INTRAVENOUS
  Administered 2015-01-24: 100 ug via INTRAVENOUS

## 2015-01-24 MED ORDER — GLYCOPYRROLATE 0.2 MG/ML IJ SOLN
INTRAMUSCULAR | Status: DC | PRN
  Administered 2015-01-24: 0.6 mg via INTRAVENOUS
  Administered 2015-01-24: 0.2 mg via INTRAVENOUS

## 2015-01-24 MED ORDER — ROCURONIUM BROMIDE 100 MG/10ML IV SOLN
INTRAVENOUS | Status: AC
  Filled 2015-01-24: qty 1

## 2015-01-24 MED ORDER — HYDROMORPHONE HCL 1 MG/ML IJ SOLN: 0.2500 mg | INTRAMUSCULAR | Status: DC | PRN

## 2015-01-24 MED ORDER — SODIUM CHLORIDE 0.9 % IV BOLUS (SEPSIS)
1000.0000 mL | Freq: Once | INTRAVENOUS | Status: AC
  Administered 2015-01-24: 1000 mL via INTRAVENOUS

## 2015-01-24 MED ORDER — CEFAZOLIN SODIUM-DEXTROSE 2-3 GM-% IV SOLR
INTRAVENOUS | Status: AC
  Filled 2015-01-24: qty 50

## 2015-01-24 MED ORDER — ROCURONIUM BROMIDE 100 MG/10ML IV SOLN
INTRAVENOUS | Status: DC | PRN
  Administered 2015-01-24: 10 mg via INTRAVENOUS
  Administered 2015-01-24: 5 mg via INTRAVENOUS
  Administered 2015-01-24: 45 mg via INTRAVENOUS
  Administered 2015-01-24 (×2): 10 mg via INTRAVENOUS

## 2015-01-24 MED ORDER — MEPERIDINE HCL 50 MG/ML IJ SOLN
6.2500 mg | INTRAMUSCULAR | Status: DC | PRN
  Administered 2015-01-24: 6.25 mg via INTRAVENOUS

## 2015-01-24 MED ORDER — DEXAMETHASONE SODIUM PHOSPHATE 10 MG/ML IJ SOLN
INTRAMUSCULAR | Status: AC
Start: 2015-01-24 — End: 2015-01-24
  Filled 2015-01-24: qty 1

## 2015-01-24 MED ORDER — ACETAMINOPHEN 325 MG PO TABS
650.0000 mg | ORAL_TABLET | ORAL | Status: DC | PRN
  Administered 2015-01-25: 650 mg via ORAL
  Filled 2015-01-24: qty 2

## 2015-01-24 MED ORDER — DIPHENHYDRAMINE HCL 50 MG/ML IJ SOLN: 12.5000 mg | Freq: Four times a day (QID) | INTRAMUSCULAR | Status: DC | PRN

## 2015-01-24 MED ORDER — FENTANYL CITRATE 0.05 MG/ML IJ SOLN
INTRAMUSCULAR | Status: AC
  Filled 2015-01-24: qty 5

## 2015-01-24 MED ORDER — PROPOFOL 10 MG/ML IV BOLUS
INTRAVENOUS | Status: DC | PRN
  Administered 2015-01-24: 180 mg via INTRAVENOUS

## 2015-01-24 MED ORDER — BUPIVACAINE-EPINEPHRINE 0.25% -1:200000 IJ SOLN
INTRAMUSCULAR | Status: DC | PRN
  Administered 2015-01-24: 30 mL

## 2015-01-24 MED ORDER — ACETAMINOPHEN 10 MG/ML IV SOLN
1000.0000 mg | Freq: Four times a day (QID) | INTRAVENOUS | Status: AC
  Administered 2015-01-24 – 2015-01-25 (×3): 1000 mg via INTRAVENOUS
  Filled 2015-01-24 (×5): qty 100

## 2015-01-24 MED ORDER — ONDANSETRON HCL 4 MG/2ML IJ SOLN: 4.0000 mg | Freq: Once | INTRAMUSCULAR | Status: DC | PRN

## 2015-01-24 MED ORDER — MEPERIDINE HCL 50 MG/ML IJ SOLN
INTRAMUSCULAR | Status: AC
  Filled 2015-01-24: qty 1

## 2015-01-24 MED ORDER — ONDANSETRON HCL 4 MG/2ML IJ SOLN
INTRAMUSCULAR | Status: DC | PRN
  Administered 2015-01-24: 4 mg via INTRAVENOUS

## 2015-01-24 MED ORDER — CEFAZOLIN SODIUM-DEXTROSE 2-3 GM-% IV SOLR
2.0000 g | INTRAVENOUS | Status: AC
  Administered 2015-01-24: 2 g via INTRAVENOUS

## 2015-01-24 MED ORDER — SODIUM CHLORIDE 0.9 % IR SOLN
Status: DC | PRN
Start: 2015-01-24 — End: 2015-01-24
  Administered 2015-01-24: 1000 mL via INTRAVESICAL

## 2015-01-24 MED ORDER — HYDROMORPHONE HCL 2 MG/ML IJ SOLN
INTRAMUSCULAR | Status: AC
  Filled 2015-01-24: qty 1

## 2015-01-24 MED ORDER — HEPARIN SODIUM (PORCINE) 1000 UNIT/ML IJ SOLN
INTRAMUSCULAR | Status: AC
  Filled 2015-01-24: qty 1

## 2015-01-24 MED ORDER — DIPHENHYDRAMINE HCL 12.5 MG/5ML PO ELIX: 12.5000 mg | ORAL_SOLUTION | Freq: Four times a day (QID) | ORAL | Status: DC | PRN

## 2015-01-24 MED ORDER — CEFAZOLIN SODIUM 1-5 GM-% IV SOLN
1.0000 g | Freq: Three times a day (TID) | INTRAVENOUS | Status: AC
  Administered 2015-01-24 – 2015-01-25 (×2): 1 g via INTRAVENOUS
  Filled 2015-01-24 (×2): qty 50

## 2015-01-24 MED ORDER — KCL IN DEXTROSE-NACL 20-5-0.45 MEQ/L-%-% IV SOLN
INTRAVENOUS | Status: DC
  Administered 2015-01-24 – 2015-01-25 (×2): via INTRAVENOUS
  Filled 2015-01-24 (×4): qty 1000

## 2015-01-24 MED ORDER — NEOSTIGMINE METHYLSULFATE 10 MG/10ML IV SOLN
INTRAVENOUS | Status: AC
  Filled 2015-01-24: qty 1

## 2015-01-24 MED ORDER — GLYCOPYRROLATE 0.2 MG/ML IJ SOLN
INTRAMUSCULAR | Status: AC
  Filled 2015-01-24: qty 1

## 2015-01-24 MED ORDER — DOCUSATE SODIUM 100 MG PO CAPS
100.0000 mg | ORAL_CAPSULE | Freq: Two times a day (BID) | ORAL | Status: DC
  Administered 2015-01-24 – 2015-01-25 (×2): 100 mg via ORAL
  Filled 2015-01-24 (×2): qty 1

## 2015-01-24 MED ORDER — FLUTICASONE PROPIONATE 50 MCG/ACT NA SUSP
2.0000 | Freq: Every day | NASAL | Status: DC | PRN
  Filled 2015-01-24: qty 16

## 2015-01-24 MED ORDER — HYDROCHLOROTHIAZIDE 25 MG PO TABS
25.0000 mg | ORAL_TABLET | Freq: Every day | ORAL | Status: DC
  Administered 2015-01-25: 25 mg via ORAL
  Filled 2015-01-24: qty 1

## 2015-01-24 SURGICAL SUPPLY — 50 items
CABLE HIGH FREQUENCY MONO STRZ (ELECTRODE) ×3 IMPLANT
CATH FOLEY 2WAY SLVR 18FR 30CC (CATHETERS) ×3 IMPLANT
CATH ROBINSON RED A/P 16FR (CATHETERS) ×3 IMPLANT
CATH ROBINSON RED A/P 8FR (CATHETERS) ×3 IMPLANT
CATH TIEMANN FOLEY 18FR 5CC (CATHETERS) ×3 IMPLANT
CHLORAPREP W/TINT 26ML (MISCELLANEOUS) ×3 IMPLANT
CLIP LIGATING HEM O LOK PURPLE (MISCELLANEOUS) ×9 IMPLANT
CLOTH BEACON ORANGE TIMEOUT ST (SAFETY) ×3 IMPLANT
COVER SURGICAL LIGHT HANDLE (MISCELLANEOUS) ×3 IMPLANT
COVER TIP SHEARS 8 DVNC (MISCELLANEOUS) ×2 IMPLANT
COVER TIP SHEARS 8MM DA VINCI (MISCELLANEOUS) ×1
CUTTER ECHEON FLEX ENDO 45 340 (ENDOMECHANICALS) ×3 IMPLANT
DECANTER SPIKE VIAL GLASS SM (MISCELLANEOUS) IMPLANT
DRAPE SURG IRRIG POUCH 19X23 (DRAPES) ×3 IMPLANT
DRSG TEGADERM 4X4.75 (GAUZE/BANDAGES/DRESSINGS) ×3 IMPLANT
DRSG TEGADERM 6X8 (GAUZE/BANDAGES/DRESSINGS) ×6 IMPLANT
ELECT REM PT RETURN 9FT ADLT (ELECTROSURGICAL) ×3
ELECTRODE REM PT RTRN 9FT ADLT (ELECTROSURGICAL) ×2 IMPLANT
GLOVE BIO SURGEON STRL SZ 6.5 (GLOVE) ×3 IMPLANT
GLOVE BIOGEL M STRL SZ7.5 (GLOVE) ×24 IMPLANT
GOWN STRL REUS W/TWL LRG LVL3 (GOWN DISPOSABLE) ×15 IMPLANT
HOLDER FOLEY CATH W/STRAP (MISCELLANEOUS) ×3 IMPLANT
IV LACTATED RINGERS 1000ML (IV SOLUTION) IMPLANT
KIT ACCESSORY DA VINCI DISP (KITS) ×1
KIT ACCESSORY DVNC DISP (KITS) ×2 IMPLANT
LIQUID BAND (GAUZE/BANDAGES/DRESSINGS) ×3 IMPLANT
NDL SAFETY ECLIPSE 18X1.5 (NEEDLE) ×2 IMPLANT
NEEDLE HYPO 18GX1.5 SHARP (NEEDLE) ×1
PACK ROBOT UROLOGY CUSTOM (CUSTOM PROCEDURE TRAY) ×3 IMPLANT
RELOAD GREEN ECHELON 45 (STAPLE) ×3 IMPLANT
SET TUBE IRRIG SUCTION NO TIP (IRRIGATION / IRRIGATOR) ×3 IMPLANT
SHEET LAVH (DRAPES) ×3 IMPLANT
SOLUTION ELECTROLUBE (MISCELLANEOUS) ×3 IMPLANT
SUT ETHILON 3 0 PS 1 (SUTURE) ×3 IMPLANT
SUT MNCRL 3 0 RB1 (SUTURE) ×2 IMPLANT
SUT MNCRL 3 0 VIOLET RB1 (SUTURE) ×2 IMPLANT
SUT MNCRL AB 4-0 PS2 18 (SUTURE) ×6 IMPLANT
SUT MONOCRYL 3 0 RB1 (SUTURE) ×2
SUT VIC AB 0 CT1 27 (SUTURE) ×1
SUT VIC AB 0 CT1 27XBRD ANTBC (SUTURE) ×2 IMPLANT
SUT VIC AB 0 UR5 27 (SUTURE) ×3 IMPLANT
SUT VIC AB 2-0 SH 27 (SUTURE) ×1
SUT VIC AB 2-0 SH 27X BRD (SUTURE) ×2 IMPLANT
SUT VIC AB 3-0 SH 27 (SUTURE) ×1
SUT VIC AB 3-0 SH 27XBRD (SUTURE) ×2 IMPLANT
SUT VICRYL 0 UR6 27IN ABS (SUTURE) ×6 IMPLANT
SYR 27GX1/2 1ML LL SAFETY (SYRINGE) ×3 IMPLANT
TOWEL OR 17X26 10 PK STRL BLUE (TOWEL DISPOSABLE) ×3 IMPLANT
TOWEL OR NON WOVEN STRL DISP B (DISPOSABLE) ×3 IMPLANT
WATER STERILE IRR 1500ML POUR (IV SOLUTION) ×6 IMPLANT

## 2015-01-24 NOTE — Op Note (Signed)
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 Robotic assisted laparoscopic radical prostatectomy (bilateral nerve sparing) 2. Bilateral robotic assisted laparoscopic pelvic lymphadenectomy  Surgeon: Pryor Curia. M.D.  Assistant: Debbrah Alar, PA-C  Resident: Dr. Park Liter  Anesthesia: General  Complications: None  EBL: 50 mL  IVF:  1900 mL crystalloid  Specimens: 1. Prostate and seminal vesicles 2. Right pelvic lymph nodes 3. Left pelvic lymph nodes  Disposition of specimens: Pathology  Drains: 1. 20 Fr coude catheter 2. # 13 Blake pelvic drain  Indication: Kenneth Weaver is a 69 y.o. year old patient with clinically localized prostate cancer.  After a thorough review of the management options for treatment of prostate cancer, he elected to proceed with surgical therapy and the above procedure(s).  We have discussed the potential benefits and risks of the procedure, side effects of the proposed treatment, the likelihood of the patient achieving the goals of the procedure, and any potential problems that might occur during the procedure or recuperation. Informed consent has been obtained.  Description of procedure:  The patient was taken to the operating room and a general anesthetic was administered. He was given preoperative antibiotics, placed in the dorsal lithotomy position, and prepped and draped in the usual sterile fashion. Next a preoperative timeout was performed. A urethral catheter was placed into the bladder and a site was selected near the umbilicus for placement of the camera port. This was placed using a standard open Hassan technique which allowed entry into the peritoneal cavity under direct vision and without difficulty. A 12 mm port was placed and a pneumoperitoneum  established. The camera was then used to inspect the abdomen and there was no evidence of any intra-abdominal injuries or other abnormalities. The remaining abdominal ports were then placed. 8 mm robotic ports were placed in the right lower quadrant, left lower quadrant, and far left lateral abdominal wall. A 5 mm port was placed in the right upper quadrant and a 12 mm port was placed in the right lateral abdominal wall for laparoscopic assistance. All ports were placed under direct vision without difficulty. The surgical cart was then docked.   Utilizing the cautery scissors, the bladder was reflected posteriorly allowing entry into the space of Retzius and identification of the endopelvic fascia and prostate. The periprostatic fat was then removed from the prostate allowing full exposure of the endopelvic fascia. The endopelvic fascia was then incised from the apex back to the base of the prostate bilaterally and the underlying levator muscle fibers were swept laterally off the prostate thereby isolating the dorsal venous complex. The dorsal vein was then stapled and divided with a 45 mm Flex Echelon stapler. Attention then turned to the bladder neck which was divided anteriorly thereby allowing entry into the bladder and exposure of the urethral catheter. The catheter balloon was deflated and the catheter was brought into the operative field and used to retract the prostate anteriorly. The posterior bladder neck was then examined and was divided allowing further dissection between the bladder and prostate posteriorly until the vasa deferentia and seminal vessels were identified. The vasa deferentia were isolated, divided, and lifted anteriorly. The seminal vesicles were dissected down to their tips with care to control the seminal vascular arterial blood supply. These structures were then lifted anteriorly and the space between Denonvillier's fascia and the anterior rectum  was developed with a combination of  sharp and blunt dissection. This isolated the vascular pedicles of the prostate.  The lateral prostatic fascia was then sharply incised allowing release of the neurovascular bundles bilaterally. The vascular pedicles of the prostate were then ligated with Weck clips between the prostate and neurovascular bundles and divided with sharp cold scissor dissection resulting in neurovascular bundle preservation. The neurovascular bundles were then separated off the apex of the prostate and urethra bilaterally.  The urethra was then sharply transected allowing the prostate specimen to be disarticulated. The pelvis was copiously irrigated and hemostasis was ensured. There was no evidence for rectal injury.  Attention then turned to the right pelvic sidewall. The fibrofatty tissue between the external iliac vein, confluence of the iliac vessels, hypogastric artery, and Cooper's ligament was dissected free from the pelvic sidewall with care to preserve the obturator nerve. Weck clips were used for lymphostasis and hemostasis. An identical procedure was performed on the contralateral side and the lymphatic packets were removed for permanent pathologic analysis.  Attention then turned to the urethral anastomosis. A 2-0 Vicryl slip knot was placed between Denonvillier's fascia, the posterior bladder neck, and the posterior urethra to reapproximate these structures. A double-armed 3-0 Monocryl suture was then used to perform a 360 running tension-free anastomosis between the bladder neck and urethra. A new urethral catheter was then placed into the bladder and irrigated. There were no blood clots within the bladder and the anastomosis appeared to be watertight. A #19 Blake drain was then brought through the left lateral 8 mm port site and positioned appropriately within the pelvis. It was secured to the skin with a nylon suture. The surgical cart was then undocked. The right lateral 12 mm port site was closed at the  fascial level with a 0 Vicryl suture placed laparoscopically. All remaining ports were then removed under direct vision. The prostate specimen was removed intact within the Endopouch retrieval bag via the periumbilical camera port site. This fascial opening was closed with two running 0 Vicryl sutures. 0.25% Marcaine was then injected into all port sites and all incisions were reapproximated at the skin level with 4-0 Monocryl subcuticular sutures and Dermabond. The patient appeared to tolerate the procedure well and without complications. The patient was able to be extubated and transferred to the recovery unit in satisfactory condition.   Pryor Curia MD

## 2015-01-24 NOTE — Discharge Instructions (Signed)

## 2015-01-24 NOTE — Interval H&P Note (Signed)
History and Physical Interval Note:  01/24/2015 10:32 AM  Kenneth Weaver  has presented today for surgery, with the diagnosis of PROSTATE CANCER  The various methods of treatment have been discussed with the patient and family. After consideration of risks, benefits and other options for treatment, the patient has consented to  Procedure(s): ROBOTIC ASSISTED LAPAROSCOPIC RADICAL PROSTATECTOMY LEVEL 3 (N/A) LYMPHADENECTOMY (Bilateral) as a surgical intervention .  The patient's history has been reviewed, patient examined, no change in status, stable for surgery.  I have reviewed the patient's chart and labs.  Questions were answered to the patient's satisfaction.     Corky Blumstein,LES

## 2015-01-24 NOTE — Transfer of Care (Signed)
Immediate Anesthesia Transfer of Care Note  Patient: Kenneth Weaver  Procedure(s) Performed: Procedure(s): ROBOTIC ASSISTED LAPAROSCOPIC RADICAL PROSTATECTOMY LEVEL 3 (N/A) LYMPHADENECTOMY (Bilateral)  Patient Location: PACU  Anesthesia Type:General  Level of Consciousness: awake, alert  and oriented  Airway & Oxygen Therapy: Patient Spontanous Breathing and Patient connected to face mask oxygen  Post-op Assessment: Report given to RN and Post -op Vital signs reviewed and stable  Post vital signs: Reviewed and stable  Last Vitals:  Filed Vitals:   01/24/15 0819  BP: 166/82  Pulse: 69  Temp: 36.8 C  Resp: 18    Complications: No apparent anesthesia complications

## 2015-01-24 NOTE — Anesthesia Procedure Notes (Signed)
Procedure Name: Intubation Date/Time: 01/24/2015 11:31 AM Performed by: Noralyn Pick Pre-anesthesia Checklist: Patient identified, Emergency Drugs available, Suction available and Patient being monitored Patient Re-evaluated:Patient Re-evaluated prior to inductionOxygen Delivery Method: Circle System Utilized Preoxygenation: Pre-oxygenation with 100% oxygen Intubation Type: IV induction Ventilation: Mask ventilation without difficulty Laryngoscope Size: Mac and 4 Tube type: Oral Tube size: 7.5 mm Number of attempts: 1 Airway Equipment and Method: Stylet and Oral airway Placement Confirmation: ETT inserted through vocal cords under direct vision,  positive ETCO2 and breath sounds checked- equal and bilateral Secured at: 23 cm Tube secured with: Tape Dental Injury: Teeth and Oropharynx as per pre-operative assessment

## 2015-01-24 NOTE — Anesthesia Postprocedure Evaluation (Signed)
  Anesthesia Post-op Note  Patient: Kenneth Weaver  Procedure(s) Performed: Procedure(s): ROBOTIC ASSISTED LAPAROSCOPIC RADICAL PROSTATECTOMY LEVEL 3 (N/A) LYMPHADENECTOMY (Bilateral)  Patient Location: PACU  Anesthesia Type:General  Level of Consciousness: awake, alert , oriented and patient cooperative  Airway and Oxygen Therapy: Patient Spontanous Breathing  Post-op Pain: mild  Post-op Assessment: Post-op Vital signs reviewed, Patient's Cardiovascular Status Stable, Respiratory Function Stable, Patent Airway, No signs of Nausea or vomiting and Pain level controlled  Post-op Vital Signs: stable  Last Vitals:  Filed Vitals:   01/24/15 1640  BP: 143/75  Pulse: 78  Temp: 36.9 C  Resp: 15    Complications: No apparent anesthesia complications

## 2015-01-24 NOTE — Progress Notes (Signed)
Patient ID: Kenneth Weaver, male   DOB: [birth year], 69 y.o.   MRN: [id number]  Post-op note  Subjective: The patient is doing well.  No complaints.  Objective: Vital signs in last 24 hours: Temp:  [98.3 F (36.8 C)-98.5 F (36.9 C)] 98.5 F (36.9 C) (03/03 1615) Pulse Rate:  [66-82] 79 (03/03 1630) Resp:  [11-20] 15 (03/03 1630) BP: (135-186)/(64-85) 135/70 mmHg (03/03 1615) SpO2:  [96 %-100 %] 100 % (03/03 1630) Weight:  [67.586 kg (149 lb)] 67.586 kg (149 lb) (03/03 0849)  Intake/Output from previous day:   Intake/Output this shift: Total I/O In: 3900 [I.V.:2800; IV Piggyback:1100] Out: 370 [Urine:250; Drains:70; Blood:50]  Physical Exam:  General: Alert and oriented. Abdomen: Soft, Nondistended. Incisions: Clean and dry. GU: Urine clearing.  Lab Results:  Recent Labs  01/24/15 1532  HGB 12.7*  HCT 37.3*    Assessment/Plan: POD#0   1) Continue to monitor, ambulate, IS   Pryor Curia. MD   LOS: 0 days   Sherman Donaldson,LES 01/24/2015, 4:38 PM

## 2015-01-24 NOTE — Anesthesia Preprocedure Evaluation (Signed)
Anesthesia Evaluation  Patient identified by MRN, date of birth, ID band Patient awake    Reviewed: Allergy & Precautions, NPO status , Patient's Chart, lab work & pertinent test results  Airway        Dental   Pulmonary          Cardiovascular hypertension, + Valvular Problems/Murmurs MVP     Neuro/Psych    GI/Hepatic PUD, GERD-  ,  Endo/Other    Renal/GU      Musculoskeletal  (+) Arthritis -,   Abdominal   Peds  Hematology   Anesthesia Other Findings   Reproductive/Obstetrics                             Anesthesia Physical Anesthesia Plan  ASA: II  Anesthesia Plan: General   Post-op Pain Management:    Induction: Intravenous  Airway Management Planned: Oral ETT  Additional Equipment:   Intra-op Plan:   Post-operative Plan: Extubation in OR  Informed Consent: I have reviewed the patients History and Physical, chart, labs and discussed the procedure including the risks, benefits and alternatives for the proposed anesthesia with the patient or authorized representative who has indicated his/her understanding and acceptance.     Plan Discussed with: CRNA, Anesthesiologist and Surgeon  Anesthesia Plan Comments:         Anesthesia Quick Evaluation

## 2015-01-25 ENCOUNTER — Encounter (HOSPITAL_COMMUNITY): Payer: Self-pay | Admitting: Urology

## 2015-01-25 LAB — HEMOGLOBIN AND HEMATOCRIT, BLOOD
HCT: 36.4 % — ABNORMAL LOW (ref 39.0–52.0)
HEMOGLOBIN: 12.2 g/dL — AB (ref 13.0–17.0)

## 2015-01-25 MED ORDER — BISACODYL 10 MG RE SUPP
10.0000 mg | Freq: Once | RECTAL | Status: AC
  Administered 2015-01-25: 10 mg via RECTAL
  Filled 2015-01-25: qty 1

## 2015-01-25 MED ORDER — TRAMADOL HCL 50 MG PO TABS: 100.0000 mg | ORAL_TABLET | Freq: Four times a day (QID) | ORAL | Status: DC | PRN

## 2015-01-25 MED ORDER — TRAMADOL HCL 50 MG PO TABS
50.0000 mg | ORAL_TABLET | Freq: Four times a day (QID) | ORAL | Status: DC | PRN
  Administered 2015-01-25: 50 mg via ORAL
  Filled 2015-01-25: qty 1

## 2015-01-25 MED ORDER — ONDANSETRON HCL 4 MG/2ML IJ SOLN
4.0000 mg | Freq: Four times a day (QID) | INTRAMUSCULAR | Status: DC | PRN
  Administered 2015-01-25: 4 mg via INTRAVENOUS
  Filled 2015-01-25: qty 2

## 2015-01-25 NOTE — Progress Notes (Signed)
I completed the D/C teaching with the patient and answered all of the questions. He was D/C with his family in stable condition.

## 2015-01-25 NOTE — Discharge Summary (Signed)
  Date of admission: 01/24/2015  Date of discharge: 01/25/2015  Admission diagnosis: Prostate Cancer  Discharge diagnosis: Prostate Cancer  History and Physical: For full details, please see admission history and physical. Briefly, Kenneth Weaver is a 69 y.o. gentleman with localized prostate cancer.  After discussing management/treatment options, he elected to proceed with surgical treatment.  Hospital Course: Kenneth Weaver was taken to the operating room on 01/24/2015 and underwent a robotic assisted laparoscopic radical prostatectomy. He tolerated this procedure well and without complications. Postoperatively, he was able to be transferred to a regular hospital room following recovery from anesthesia.  He was able to begin ambulating the night of surgery. He remained hemodynamically stable overnight.  He had excellent urine output with appropriately minimal output from his pelvic drain and his pelvic drain was removed on POD #1.  He was transitioned to oral pain medication, tolerated a clear liquid diet, and had met all discharge criteria and was able to be discharged home later on POD#1.  Laboratory values:  Recent Labs  01/24/15 1532 01/25/15 0450  HGB 12.7* 12.2*  HCT 37.3* 36.4*    Disposition: Home  Discharge instruction: He was instructed to be ambulatory but to refrain from heavy lifting, strenuous activity, or driving. He was instructed on urethral catheter care.  Discharge medications:     Medication List    STOP taking these medications        aspirin EC 81 MG tablet     CALCIUM 600 + D PO     CHLOR-TABLETS PO     cholecalciferol 1000 UNITS tablet  Commonly known as:  VITAMIN D     Cinnamon 500 MG Tabs     GLUCOSAMINE MSM COMPLEX PO     Magnesium 400 MG Tabs     vitamin B-12 500 MCG tablet  Commonly known as:  CYANOCOBALAMIN      TAKE these medications        acetaminophen 500 MG tablet  Commonly known as:  TYLENOL  Take 500-1,000 mg by mouth every 6  (six) hours as needed for moderate pain or headache.     ciprofloxacin 500 MG tablet  Commonly known as:  CIPRO  Take 1 tablet (500 mg total) by mouth 2 (two) times daily. Start day prior to office visit for foley removal     fluticasone 50 MCG/ACT nasal spray  Commonly known as:  FLONASE  Place 2 sprays into both nostrils daily as needed for allergies or rhinitis.     hydrochlorothiazide 25 MG tablet  Commonly known as:  HYDRODIURIL  Take 25 mg by mouth daily with lunch.     HYDROcodone-acetaminophen 5-325 MG per tablet  Commonly known as:  NORCO  Take 1-2 tablets by mouth every 6 (six) hours as needed.     hydrocortisone cream 1 %  Apply 1 application topically daily as needed for itching.     METAMUCIL PO  Take 3 scoop by mouth every morning.     olmesartan 20 MG tablet  Commonly known as:  BENICAR  Take 20 mg by mouth daily with lunch.        Followup: He will followup in 1 week for catheter removal and to discuss his surgical pathology results.

## 2015-01-25 NOTE — Progress Notes (Signed)
Patient ID: Kenneth Weaver, male   DOB: November 03, 1946, 69 y.o.   MRN: [id number]  1 Day Post-Op Subjective: The patient is doing well.  No nausea or vomiting. Pain is adequately controlled.  Objective: Vital signs in last 24 hours: Temp:  [98.3 F (36.8 C)-99.4 F (37.4 C)] 98.9 F (37.2 C) (03/04 0407) Pulse Rate:  [55-82] 57 (03/04 0407) Resp:  [11-20] 16 (03/04 0407) BP: (127-186)/(64-85) 127/67 mmHg (03/04 0407) SpO2:  [96 %-100 %] 97 % (03/04 0407) Weight:  [67.586 kg (149 lb)] 67.586 kg (149 lb) (03/03 0849)  Intake/Output from previous day: 03/03 0701 - 03/04 0700 In: 6710 [P.O.:500; I.V.:4810; IV Piggyback:1400] Out: 1985 [ZSMOL:0786; Drains:110; Blood:50] Intake/Output this shift:    Physical Exam:  General: Alert and oriented. CV: RRR Lungs: Clear bilaterally. GI: Soft, Nondistended. Incisions: Clean, dry, and intact Urine: Clear Extremities: Nontender, no erythema, no edema.  Lab Results:  Recent Labs  01/24/15 1532 01/25/15 0450  HGB 12.7* 12.2*  HCT 37.3* 36.4*      Assessment/Plan: POD# 1 s/p robotic prostatectomy.  1) SL IVF 2) Ambulate, Incentive spirometry 3) Transition to oral pain medication 4) Dulcolax suppository 5) D/C pelvic drain 6) Plan for likely discharge later today   Kenneth Weaver. Kenneth Weaver   LOS: 1 day   Kenneth Weaver,Kenneth Weaver 01/25/2015, 7:35 AM

## 2015-02-11 ENCOUNTER — Encounter: Payer: Self-pay | Admitting: Internal Medicine

## 2015-02-11 ENCOUNTER — Ambulatory Visit (INDEPENDENT_AMBULATORY_CARE_PROVIDER_SITE_OTHER): Payer: Medicare Other | Admitting: Internal Medicine

## 2015-02-11 VITALS — BP 138/78 | HR 76 | Ht 72.0 in | Wt 144.0 lb

## 2015-02-11 DIAGNOSIS — R059 Cough, unspecified: Secondary | ICD-10-CM

## 2015-02-11 DIAGNOSIS — R05 Cough: Secondary | ICD-10-CM

## 2015-02-11 DIAGNOSIS — R918 Other nonspecific abnormal finding of lung field: Secondary | ICD-10-CM | POA: Diagnosis not present

## 2015-02-11 NOTE — Patient Instructions (Addendum)
Please schedule a follow up visit in 3 months but call sooner if needed with cxr on return Late add LUL x 1.4 cm will need ct set up then

## 2015-02-11 NOTE — Progress Notes (Signed)
Subjective:     Patient ID: Kenneth Weaver, male   DOB: 07-18-1946,    MRN: [id number]  HPI  65 yowm never smoker eval by Dr Gwenette Greet for cough in 2009 felt to be upper airway saw allergist at [facility] "better p 3 doses augmentin" and never recurred referred 02/11/2015 to pulmonary clinic 02/11/15 by Dr Raynelle Bring  for abn CT chest c/w MAI   02/11/2015 1st [facility] office visit/ Wert   Chief Complaint  Patient presents with  . Advice Only    lung mass; no symptoms; seen on CXR and f/u with CT     No longer coughing.  Not limited by breathing from desired activities    Has extensive travel hx all over the world.  No   cp or chest tightness, subjective wheeze overt sinus or hb symptoms. No unusual exp hx or h/o childhood pna/ asthma or knowledge of premature birth.  Sleeping ok without nocturnal  or early am exacerbation  of respiratory  c/o's or need for noct saba. Also denies any obvious fluctuation of symptoms with weather or environmental changes or other aggravating or alleviating factors except as outlined above   Current Medications, Allergies, Complete Past Medical History, Past Surgical History, Family History, and Social History were reviewed in [employer] record.  ROS  The following are not active complaints unless bolded sore throat, dysphagia, dental problems, itching, sneezing,  nasal congestion or excess/ purulent secretions, ear ache,   fever, chills, sweats, unintended wt loss, pleuritic or exertional cp, hemoptysis,  orthopnea pnd or leg swelling, presyncope, palpitations, heartburn, abdominal pain, anorexia, nausea, vomiting, diarrhea  or change in bowel or urinary habits, change in stools or urine, dysuria,hematuria,  rash, arthralgias, visual complaints, headache, numbness weakness or ataxia or problems with walking or coordination,  change in mood/affect or memory.        Review of Systems     Objective:   Physical Exam    amb wm  nad  Wt Readings from Last 3 Encounters:  02/11/15 144 lb (65.318 kg)  09/06/08 159 lb 2.1 oz (72.181 kg)  08/09/08 155 lb 4 oz (70.421 kg)    Vital signs reviewed  HEENT: nl dentition, turbinates, and orophanx. Nl external ear canals without cough reflex   NECK :  without JVD/Nodes/TM/ nl carotid upstrokes bilaterally   LUNGS: no acc muscle use, clear to A and P bilaterally without cough on insp or exp maneuvers   CV:  RRR  no s3 or murmur or increase in P2, no edema   ABD:  soft and nontender with nl excursion in the supine position. No bruits or organomegaly, bowel sounds nl  MS:  warm without deformities, calf tenderness, cyanosis or clubbing  SKIN: warm and dry without lesions    NEURO:  alert, approp, no deficits     I personally reviewed images and agree with radiology impression as follows:  CXR:  02/14/15 Minimal peribronchial thickening and hyperinflation with a questionable nodular density versus rib lesion or artifact in the lateral LEFT upper lobe.  Assessment:

## 2015-02-12 ENCOUNTER — Encounter: Payer: Self-pay | Admitting: Internal Medicine

## 2015-02-12 DIAGNOSIS — R918 Other nonspecific abnormal finding of lung field: Secondary | ICD-10-CM | POA: Insufficient documentation

## 2015-02-12 HISTORY — DX: Other nonspecific abnormal finding of lung field: R91.8

## 2015-02-12 NOTE — Assessment & Plan Note (Addendum)
-   See CT chest Jan 18 2015 [facility] Multiple tree in bud nodules plus one macrolobulated nodule 1.4 cm LUL but does not correlated with cxr  Extended discussion with pt x 35 min of 60 min ov  Although there are clearly abnormalities on CT scan, they should probably be considered "microscopic" since not obvious on plain cxr (no correlation with cxr)   Lesions are mostly  Too small to  bx, not suspicious enough for excisional bx > really only option for now is follow the Fleischner society guidelines as rec by radiology.    The largest of the nodules might be detected on PET but we've have numerous cases of false positive pets in this setting  Discussed in detail all the  indications, usual  risks and alternatives  relative to the benefits with patient who agrees to proceed with conservative f/u with ct chest in 3 months as per radiology recs

## 2015-02-12 NOTE — Assessment & Plan Note (Addendum)
Most likely this was an upper airway cough syndrome,  Resolved on rx with nasal steroids, no need to change rx

## 2015-05-13 ENCOUNTER — Ambulatory Visit (INDEPENDENT_AMBULATORY_CARE_PROVIDER_SITE_OTHER): Payer: Medicare Other | Admitting: Internal Medicine

## 2015-05-13 ENCOUNTER — Encounter: Payer: Self-pay | Admitting: Internal Medicine

## 2015-05-13 ENCOUNTER — Ambulatory Visit (INDEPENDENT_AMBULATORY_CARE_PROVIDER_SITE_OTHER)
Admission: RE | Admit: 2015-05-13 | Discharge: 2015-05-13 | Disposition: A | Discharge status: Home / Self Care | Payer: Medicare Other | Source: Ambulatory Visit | Attending: Internal Medicine | Admitting: Internal Medicine

## 2015-05-13 DIAGNOSIS — R918 Other nonspecific abnormal finding of lung field: Secondary | ICD-10-CM

## 2015-05-13 NOTE — Progress Notes (Signed)
Subjective:     Patient ID: Kenneth Weaver, male   DOB: Aug 06, 1946,    MRN: [id number]    Brief patient profile:  50 yowm never smoker eval by Dr Kenneth Weaver for cough in 2009 felt to be upper airway saw allergist at [facility] "better p 3 doses augmentin" and never recurred referred 02/11/2015 to pulmonary clinic 02/11/15 by Dr Kenneth Weaver  for abn CT chest c/w MAI    History of Present Illness  02/11/2015 1st [facility] office visit/ Kenneth Weaver   Chief Complaint  Patient presents with  . Advice Only    lung mass; no symptoms; seen on CXR and f/u with CT  No longer coughing.  Not limited by breathing from desired activities   Has extensive travel hx all over the world. rec F/u cxr 3 m    05/13/2015 f/u ov/Kenneth Weaver re: MPNs Chief Complaint  Patient presents with  . Follow-up    Pt states doing well and denies any new co's today.     No  Sob or cough or cp or chest tightness, subjective wheeze overt sinus or hb symptoms. No unusual exp hx or h/o childhood pna/ asthma or knowledge of premature birth.  Sleeping ok without nocturnal  or early am exacerbation  of respiratory  c/o's or need for noct saba. Also denies any obvious fluctuation of symptoms with weather or environmental changes or other aggravating or alleviating factors except as outlined above   Current Medications, Allergies, Complete Past Medical History, Past Surgical History, Family History, and Social History were reviewed in [employer] record.  ROS  The following are not active complaints unless bolded sore throat, dysphagia, dental problems, itching, sneezing,  nasal congestion or excess/ purulent secretions, ear ache,   fever, chills, sweats, unintended wt loss, pleuritic or exertional cp, hemoptysis,  orthopnea pnd or leg swelling, presyncope, palpitations, heartburn, abdominal pain, anorexia, nausea, vomiting, diarrhea  or change in bowel or urinary habits, change in stools or urine, dysuria,hematuria,  rash,  arthralgias, visual complaints, headache, numbness weakness or ataxia or problems with walking or coordination,  change in mood/affect or memory.            Objective:   Physical Exam    amb wm nad  05/13/2015        147  Wt Readings from Last 3 Encounters:  02/11/15 144 lb (65.318 kg)  09/06/08 159 lb 2.1 oz (72.181 kg)  08/09/08 155 lb 4 oz (70.421 kg)    Vital signs reviewed  HEENT: nl dentition, turbinates, and orophanx. Nl external ear canals without cough reflex   NECK :  without JVD/Nodes/TM/ nl carotid upstrokes bilaterally   LUNGS: no acc muscle use, clear to A and P bilaterally without cough on insp or exp maneuvers   CV:  RRR  no s3 or murmur or increase in P2, no edema   ABD:  soft and nontender with nl excursion in the supine position. 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 Allowing for differences in technique, these appear unchanged 01/18/2015 chest CT.  My impression: the nodule of greatest concern in the LUL is clearly seen on cxr and is unchanged from prev study  Assessment:

## 2015-05-13 NOTE — Patient Instructions (Signed)
Please remember to go to the   x-ray department downstairs for your tests - we will call you with the results when they are available.  Please schedule a follow up visit in 6 months but call sooner if needed        

## 2015-05-14 NOTE — Progress Notes (Signed)
Quick Note:  Spoke with pt and notified of results per Dr. Wert. Pt verbalized understanding and denied any questions.  ______ 

## 2015-05-20 ENCOUNTER — Encounter: Payer: Self-pay | Admitting: Internal Medicine

## 2015-05-20 NOTE — Assessment & Plan Note (Addendum)
-   See CT chest Jan 18 2015 [facility] Multiple tree in bud nodules plus one macrolobulated nodule 1.4 cm LUL  - in tickle file for ov 11/12/15   Since the lesion in question is assoc with bronchiectasis in a never smoker and assoc with mpn's I strongly doubt malignancy and he does not have any resp symptoms to suggest active mai  I had an extended discussion with the patient reviewing all relevant studies completed to date and  lasting 15 to 20 minutes of a 25 minute visit on the following ongoing concerns:  Discussed in detail all the  indications, usual  risks and alternatives  relative to the benefits with patient who agrees to proceed with  Conservative f/u with cxr in 6 m   Each maintenance medication was reviewed in detail including most importantly the difference between maintenance and as needed and under what circumstances the prns are to be used.  Please see instructions for details which were reviewed in writing and the patient given a copy.

## 2015-07-17 ENCOUNTER — Encounter: Payer: Self-pay | Admitting: Radiation Oncology

## 2015-07-17 NOTE — Progress Notes (Signed)
GU Location of Tumor / Histology: Adenocarcinoma of the Prostate  Diagnosis 1. 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 - CARCINOMA INVOLVES BOTH LOBES. - FOCALLY POSITIVE APEX MARGINS. - SEE ONCOLOGY TABLE AND COMMENT. 2. Lymph nodes, regional resection, left pelvic - TWO OF TWO LYMPH NODES NEGATIVE FOR CARCINOMA (0/2). 3. Lymph nodes, regional resection, right pelvic - TWO OF TWO LYMPH NODES NEGATIVE FOR CARCINOMA (0/2). Microscopic Comment 1. PROSTATE RADICAL RESECTION/PROSTATECTOMY Procedure: Prostatectomy. Prostate gland: Weight: 52 grams. Dimension: 3.5 cm apex to base x 5.5 cm right to left x 3.8 cm anterior to posterior. Histologic type: Prostatic adenocarcinoma, acinar type. Gleasons Score: 3+4=7 Tertiary score (if applicable): N/A. Involving (half lobe or less, one or both lobes): Both lobes. Estimated proportion (%) of prostate tissue involved by tumor: 20%. Extraprostatic extension: Not identified. Involvement of apex: Present. Margins: Focally positive at right apical margin (predominately pattern 3) and left apical margin (pattern 4). Seminal vesicles: Uninvolved. Lymph-Vascular invasion: Not identified. Treatment effect: N/A. Lymph nodes: number examined 4 ; number positive 0 TNM code: pT2c, pN0 Comments: There is perineural invasion. There is high grade prostatic intraepithelial neoplasia. Deeper levels were performed on the apical margins. The right apical margin is focally positive with predominately pattern 3. 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 Diona Fanti to perform a prostate needle biopsy on 11/08/14. This confirmed Gleason 4+3=7 adenocarcinoma of the prostate with 5 out of 12 biopsy cores positive for malignancy. He  has no family history of prostate cancer. His father did have bladder cancer. His does have longevity in his family with his father living to age 18 and his mother living to age 104. He has been thoroughly counseled about his treatment options by Dr. Diona Fanti and is most interested in surgical treatment."  Past/Anticipated interventions by urology, if any: Prostate Biospy and Lymphadenectomy( 2/0 nodes).  Past/Anticipated interventions by medical oncology, if any: Unknown  Weight changes, if any: Lost 10 lbs after surgery and has gained back 5 lbs at weight of 146.1 lb  Bowel/Bladder complaints, if any: Denies any issues  Nausea/Vomiting, if any: none  Pain issues, if any: none  SAFETY ISSUES:  Prior radiation? No  Pacemaker/ICD? No  Possible current pregnancy? N/A  Is the patient on methotrexate? No  Current Complaints / other details:  Slowly tapering off of Cialis due to muscular back pain and cramping.

## 2015-07-18 ENCOUNTER — Ambulatory Visit
Admission: RE | Admit: 2015-07-18 | Discharge: 2015-07-18 | Disposition: A | Discharge status: Home / Self Care | Payer: Medicare Other | Source: Ambulatory Visit | Attending: Radiation Oncology | Admitting: Radiation Oncology

## 2015-07-18 ENCOUNTER — Encounter: Payer: Self-pay | Admitting: Radiation Oncology

## 2015-07-18 VITALS — BP 143/59 | HR 60 | Temp 98.3°F | Ht 71.0 in | Wt 146.1 lb

## 2015-07-18 DIAGNOSIS — C61 Malignant neoplasm of prostate: Secondary | ICD-10-CM | POA: Insufficient documentation

## 2015-07-18 DIAGNOSIS — K649 Unspecified hemorrhoids: Secondary | ICD-10-CM | POA: Insufficient documentation

## 2015-07-18 DIAGNOSIS — Z79899 Other long term (current) drug therapy: Secondary | ICD-10-CM | POA: Insufficient documentation

## 2015-07-18 DIAGNOSIS — I341 Nonrheumatic mitral (valve) prolapse: Secondary | ICD-10-CM | POA: Insufficient documentation

## 2015-07-18 DIAGNOSIS — R011 Cardiac murmur, unspecified: Secondary | ICD-10-CM | POA: Diagnosis not present

## 2015-07-18 DIAGNOSIS — Z7982 Long term (current) use of aspirin: Secondary | ICD-10-CM | POA: Insufficient documentation

## 2015-07-18 DIAGNOSIS — K269 Duodenal ulcer, unspecified as acute or chronic, without hemorrhage or perforation: Secondary | ICD-10-CM | POA: Diagnosis not present

## 2015-07-18 DIAGNOSIS — M1990 Unspecified osteoarthritis, unspecified site: Secondary | ICD-10-CM | POA: Diagnosis not present

## 2015-07-18 DIAGNOSIS — I10 Essential (primary) hypertension: Secondary | ICD-10-CM | POA: Diagnosis not present

## 2015-07-18 HISTORY — DX: Personal history of other diseases of the digestive system: Z87.19

## 2015-07-18 HISTORY — DX: Personal history of peptic ulcer disease: Z87.11

## 2015-07-18 HISTORY — DX: Nonrheumatic mitral (valve) prolapse: I34.1

## 2015-07-18 NOTE — Progress Notes (Signed)
[facility] NEW PATIENT EVALUATION  Name: Kenneth Weaver MRN: [id number]  Date:   07/18/2015           DOB: [birth year]  Status: outpatient   CC: Kenneth Kroner, MD  Kenneth Bring, MD    REFERRING PHYSICIAN: Raynelle Bring, MD   DIAGNOSIS: Pathologic stage T2c N0 adenocarcinoma prostate   HISTORY OF PRESENT ILLNESS:  Kenneth Weaver is a 69 y.o. male who is seen today for the courtesy Dr. Alinda Weaver for discussion of the role of radiation therapy in the management of his pathologic stage  T2c adenocarcinoma prostate.  He presented with a rising PSA which reached 6.4 by late last year.  On 11/08/2014 Dr. Diona Weaver performed ultrasound-guided biopsies was found have Gleason 7 (4+3) involving 5 of 12 biopsies.  He was seen by Dr. Alinda Weaver and underwent a nerve sparing robotic prostatectomy on 01/24/2015.  His then have Gleason 7 (3+4) involving both lobes.  The right apical margin was focally positive with a predominantly pattern 3.  There was also a very small focus at the margin on the left side, pattern 4.  His PSAs, postoperatively, have been undetectable.  He is doing well from a GU and GI standpoint although he is slow to regain his erectile function.  He is not having any urinary incontinence.  He is quite pleased with his situation.    PREVIOUS RADIATION THERAPY: No   PAST MEDICAL HISTORY:  has a past medical history of Hypertension; Heart murmur; Hemorrhoids; Arthritis; Cancer; Prostate cancer (01/24/2015); Duodenal ulcer; Mitral valve prolapse; and gastric ulcer.     PAST SURGICAL HISTORY:  Past Surgical History  Procedure Laterality Date  . Salivary stone removal    . Cardiac catheterization      '61-[facility] age 76, evaluate heart murmur-dx."MVP"  . Hernia repair      laparoscopic  . Robot assisted laparoscopic radical prostatectomy N/A 01/24/2015    Procedure: ROBOTIC ASSISTED LAPAROSCOPIC RADICAL PROSTATECTOMY LEVEL 3;  Surgeon: Kenneth Bring, MD;   Location: [facility];  Service: Urology;  Laterality: N/A;  . Lymphadenectomy Bilateral 01/24/2015    Procedure: LYMPHADENECTOMY;  Surgeon: Kenneth Bring, MD;  Location: [facility];  Service: Urology;  Laterality: Bilateral;  . Prostate biopsy  01/24/2015     FAMILY HISTORY:   His father had bladder cancer and died at age 8.  His mother died following a stroke at 1.  No known family history of prostate cancer.   SOCIAL HISTORY:  reports that he has never smoked. He does not have any smokeless tobacco history on file. He reports that he does not drink alcohol or use illicit drugs.  Retired [employer].  3 children and 3 grandchildren.   ALLERGIES: Celebrex; Crestor; Diltiazem; Ramipril; Reglan; Losartan; and Tetracyclines & related   MEDICATIONS:  Current Outpatient Prescriptions  Medication Sig Dispense Refill  . acetaminophen (TYLENOL) 500 MG tablet Take 500-1,000 mg by mouth every 6 (six) hours as needed for moderate pain or headache.    . fluticasone (FLONASE) 50 MCG/ACT nasal spray Place 2 sprays into both nostrils daily as needed for allergies or rhinitis.    . hydrocortisone cream 1 % Apply 1 application topically daily as needed for itching.    . olmesartan (BENICAR) 20 MG tablet Take 20 mg by mouth daily with lunch.     . Psyllium (METAMUCIL PO) Take 3 scoop by mouth every morning.    . tadalafil (CIALIS) 5 MG tablet Take 5 mg by mouth  daily.     No current facility-administered medications for this encounter.     REVIEW OF SYSTEMS:  Pertinent items are noted in HPI.    PHYSICAL EXAM:  height is 5\' 11"  (1.803 m) and weight is 146 lb 1.6 oz (66.271 kg). His temperature is 98.3 F (36.8 C). His blood pressure is 143/59 and his pulse is 60.   Alert and oriented.  Rectal examination not performed today.     LABORATORY DATA:  Lab Results  Component Value Date   WBC 4.5 01/16/2015   HGB 12.2* 01/25/2015   HCT 36.4* 01/25/2015   MCV 90.6 01/16/2015   PLT 286 01/16/2015    Lab Results  Component Value Date   NA 136 01/16/2015   K 4.5 01/16/2015   CL 97 01/16/2015   CO2 32 01/16/2015   No results found for: ALT, AST, GGT, ALKPHOS, BILITOT   PSA less than 0.01 from 07/03/2015   IMPRESSION:Pathologic stageT2c adenocarcinoma prostate.  We discussed the natural history of prostate cancer.  We also discussed predictors for local recurrence which include a positive margin, PSA disease-free interval, PSA doubling time, and initial Gleason score/PSA level.  We also discussed the degree of margin positivity and that patients with  focal positive margins are often cured with surgery alone.  We discussed the difference between adjuvant and salvage radiation therapy.  We discussed Dr. Melony Weaver review of the literature [facility]), and recommendations regarding the timing of salvage radiation therapy.  We reviewed the potential acute and late toxicities of radiation therapy.  I told [name] that I think it would be most appropriate to simply obtain follow-up PSA determinations and offer radiation therapy if and when he develops a biochemical recurrence.  Treatment should be given before a PSA reaches 0.2.  He feels comfortable with this strategy.   PLAN: As above.        I spent 30 minutes minutes face to face with the patient and more than 50% of that time was spent in counseling and/or coordination of care.

## 2015-07-18 NOTE — Addendum Note (Signed)
Encounter addended by: Benn Moulder, RN on: 07/18/2015  3:54 PM<BR>     Documentation filed: Charges VN

## 2015-11-12 ENCOUNTER — Encounter: Payer: Self-pay | Admitting: Internal Medicine

## 2015-11-12 ENCOUNTER — Ambulatory Visit (INDEPENDENT_AMBULATORY_CARE_PROVIDER_SITE_OTHER): Payer: Medicare Other | Admitting: Internal Medicine

## 2015-11-12 ENCOUNTER — Ambulatory Visit (INDEPENDENT_AMBULATORY_CARE_PROVIDER_SITE_OTHER)
Admission: RE | Admit: 2015-11-12 | Discharge: 2015-11-12 | Disposition: A | Discharge status: Home / Self Care | Payer: Medicare Other | Source: Ambulatory Visit | Attending: Internal Medicine | Admitting: Internal Medicine

## 2015-11-12 DIAGNOSIS — R918 Other nonspecific abnormal finding of lung field: Secondary | ICD-10-CM | POA: Diagnosis not present

## 2015-11-12 NOTE — Progress Notes (Signed)
Subjective:     Patient ID: Kenneth Weaver, male   DOB: [birth year],    MRN: [id number]    Brief patient profile:  69yowm never smoker eval by Dr Gwenette Greet for cough in 2009 felt to be upper airway saw allergist at [facility] "better p 3 doses augmentin" and never recurred referred 02/11/2015 to pulmonary clinic 02/11/15 by Dr Raynelle Bring  for abn CT chest c/w MAI    History of Present Illness  02/11/2015 1st [facility] office visit/ Dayshon Roback   Chief Complaint  Patient presents with  . Advice Only    lung mass; no symptoms; seen on CXR and f/u with CT  No longer coughing.  Not limited by breathing from desired activities   Has extensive travel hx all over the world. rec F/u cxr 's only for mpn's/ ? mai    11/12/2015  f/u ov/Jahzaria Vary re: mpns  No obvious day to day or daytime variability or assoc chronic cough or cp or chest tightness, subjective wheeze or overt sinus or hb symptoms. No unusual exp hx or h/o childhood pna/ asthma or knowledge of premature birth.  Sleeping ok without nocturnal  or early am exacerbation  of respiratory  c/o's or need for noct saba. Also denies any obvious fluctuation of symptoms with weather or environmental changes or other aggravating or alleviating factors except as outlined above   Current Medications, Allergies, Complete Past Medical History, Past Surgical History, Family History, and Social History were reviewed in [employer] record.  ROS  The following are not active complaints unless bolded sore throat, dysphagia, dental problems, itching, sneezing,  nasal congestion or excess/ purulent secretions, ear ache,   fever, chills, sweats, unintended wt loss, classically pleuritic or exertional cp, hemoptysis,  orthopnea pnd or leg swelling, presyncope, palpitations, abdominal pain, anorexia, nausea, vomiting, diarrhea  or change in bowel or bladder habits, change in stools or urine, dysuria,hematuria,  rash, arthralgias, visual complaints,  headache, numbness, weakness or ataxia or problems with walking or coordination,  change in mood/affect or memory.                  Objective:   Physical Exam    amb wm nad  05/13/2015        147  > 11/12/2015     149     02/11/15 144 lb (65.318 kg)  09/06/08 159 lb 2.1 oz (72.181 kg)  08/09/08 155 lb 4 oz (70.421 kg)    Vital signs reviewed  HEENT: nl dentition, turbinates, and orophanx. Nl external ear canals without cough reflex   NECK :  without JVD/Nodes/TM/ nl carotid upstrokes bilaterally   LUNGS: no acc muscle use, clear to A and P bilaterally without cough on insp or exp maneuvers   CV:  RRR  no s3 or murmur or increase in P2, no edema   ABD:  soft and nontender with nl excursion in the supine position. No bruits or organomegaly, bowel sounds nl  MS:  warm without deformities, calf tenderness, cyanosis or clubbing  SKIN: warm and dry without lesions    NEURO:  alert, approp, no deficits     I personally reviewed images and agree with radiology impression as follows:  CXR: 11/12/2015  No acute disease.  Scattered clusters of micro nodules compatible with remote infectious or inflammatory process are unchanged.   Assessment:

## 2015-11-12 NOTE — Patient Instructions (Signed)
No additional xrays are needed from my perspective - call right away for cough that doesn't resolve over 3 weeks or pain with breathing or short of breathing

## 2015-11-13 NOTE — Assessment & Plan Note (Signed)
-   See CT chest Jan 18 2015 [facility] Multiple tree in bud nodules plus one macrolobulated nodule 1.4 cm LUL  - f/u ov 11/12/2015 no change/ no symtoms > f/u prn   I can no longer see the obvious macronodular was present in the left upper lobe corresponding to the 1.4 cm lesion seen on CT scan and suspect this was inflammatory as are all the other nodules. In this never smoker I see no advantage of serial CTs or for that matter chest x-rays unless he has specific symptoms such as refractory cough pain with breathing unexplained weight loss fever chills or sweats. Pulmonary follow-up can be on an as-needed basis.

## 2016-01-10 DIAGNOSIS — C61 Malignant neoplasm of prostate: Secondary | ICD-10-CM | POA: Diagnosis not present

## 2016-01-17 DIAGNOSIS — Z0000 Encounter for general adult medical examination without abnormal findings: Secondary | ICD-10-CM | POA: Diagnosis not present

## 2016-01-17 DIAGNOSIS — N5201 Erectile dysfunction due to arterial insufficiency: Secondary | ICD-10-CM | POA: Diagnosis not present

## 2016-01-17 DIAGNOSIS — C61 Malignant neoplasm of prostate: Secondary | ICD-10-CM | POA: Diagnosis not present

## 2016-02-03 DIAGNOSIS — H52223 Regular astigmatism, bilateral: Secondary | ICD-10-CM | POA: Diagnosis not present

## 2016-02-03 DIAGNOSIS — H31092 Other chorioretinal scars, left eye: Secondary | ICD-10-CM | POA: Diagnosis not present

## 2016-02-03 DIAGNOSIS — H524 Presbyopia: Secondary | ICD-10-CM | POA: Diagnosis not present

## 2016-02-03 DIAGNOSIS — H2513 Age-related nuclear cataract, bilateral: Secondary | ICD-10-CM | POA: Diagnosis not present

## 2016-02-03 DIAGNOSIS — H5203 Hypermetropia, bilateral: Secondary | ICD-10-CM | POA: Diagnosis not present

## 2016-03-26 IMAGING — DX DG CHEST 2V
2 series · 2 of 2 positions shown · non-contrast
Comparison: CT chest 01/18/2015. PA and lateral chest 03/28/2010,
01/16/2015 and 05/13/2015.

CLINICAL DATA: History of pulmonary nodules.

EXAM:
CHEST  2 VIEW

[chest pa]
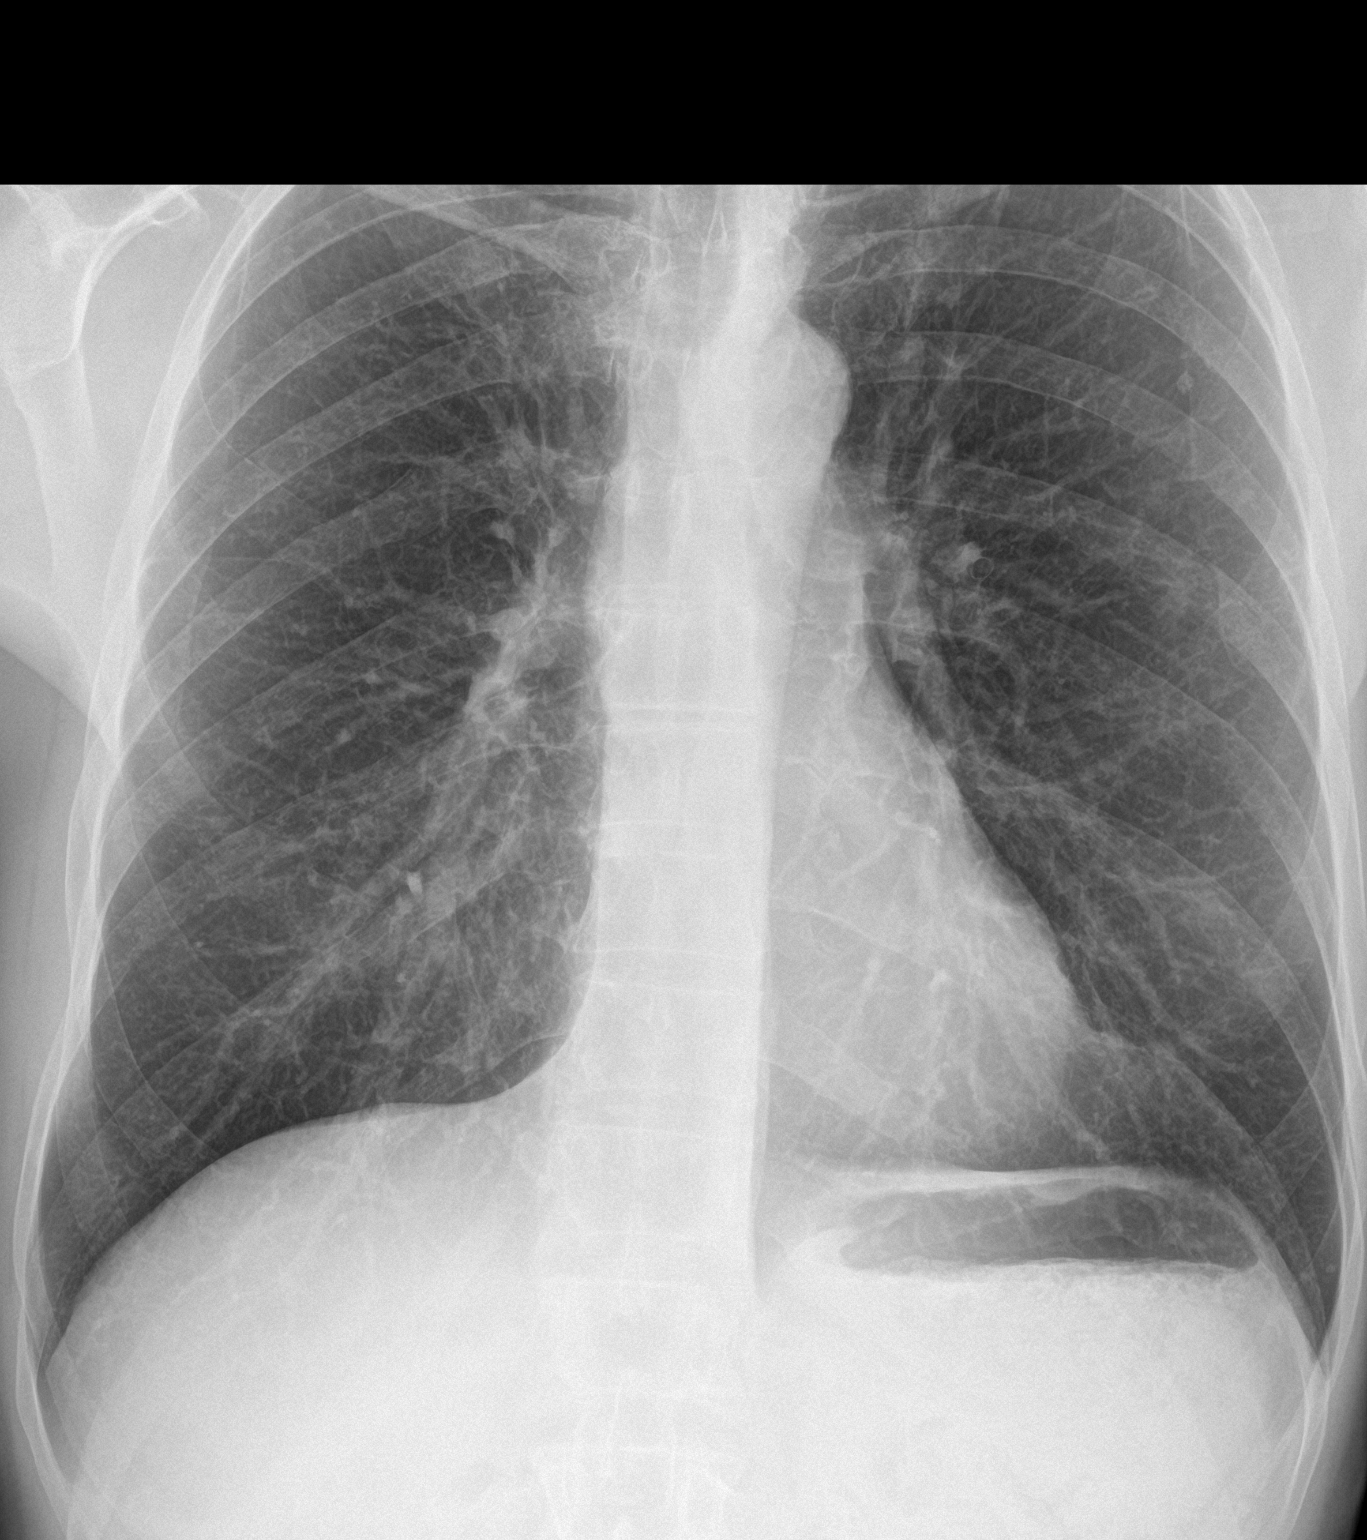

[chest lat]
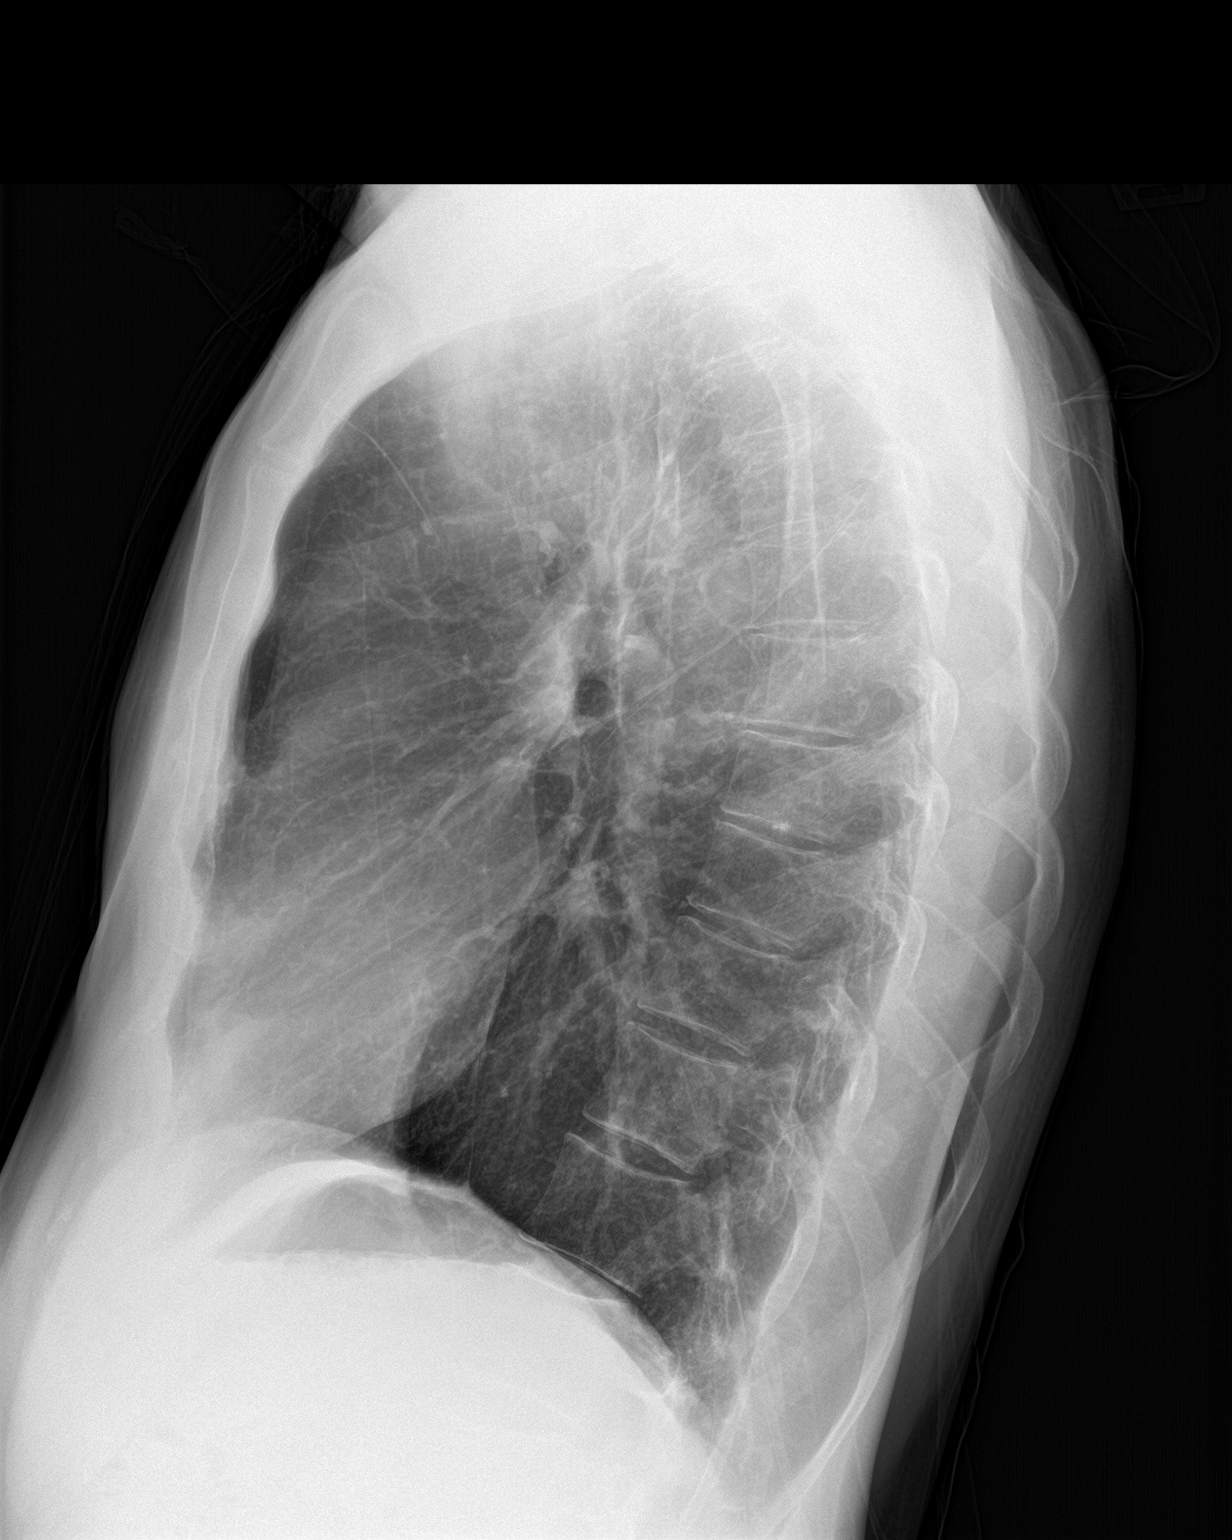

[2 of 2 positions shown; findings below may reference images not displayed]

FINDINGS: Cluster of micronodules in the left upper lobe and right lower lobe
appear unchanged. No new nodule is identified. No consolidative
process, pneumothorax or effusion. Heart size is normal.
IMPRESSION: No acute disease.

Scattered clusters of micro nodules compatible with remote
infectious or inflammatory process are unchanged.

## 2016-04-06 DIAGNOSIS — L570 Actinic keratosis: Secondary | ICD-10-CM | POA: Diagnosis not present

## 2016-04-06 DIAGNOSIS — D1801 Hemangioma of skin and subcutaneous tissue: Secondary | ICD-10-CM | POA: Diagnosis not present

## 2016-04-06 DIAGNOSIS — B351 Tinea unguium: Secondary | ICD-10-CM | POA: Diagnosis not present

## 2016-04-06 DIAGNOSIS — Z86008 Personal history of in-situ neoplasm of other site: Secondary | ICD-10-CM | POA: Diagnosis not present

## 2016-04-06 DIAGNOSIS — L814 Other melanin hyperpigmentation: Secondary | ICD-10-CM | POA: Diagnosis not present

## 2016-04-06 DIAGNOSIS — L821 Other seborrheic keratosis: Secondary | ICD-10-CM | POA: Diagnosis not present

## 2016-04-09 DIAGNOSIS — Z8546 Personal history of malignant neoplasm of prostate: Secondary | ICD-10-CM | POA: Diagnosis not present

## 2016-04-09 DIAGNOSIS — I10 Essential (primary) hypertension: Secondary | ICD-10-CM | POA: Diagnosis not present

## 2016-04-09 DIAGNOSIS — N529 Male erectile dysfunction, unspecified: Secondary | ICD-10-CM | POA: Diagnosis not present

## 2016-04-09 DIAGNOSIS — J309 Allergic rhinitis, unspecified: Secondary | ICD-10-CM | POA: Diagnosis not present

## 2016-04-09 DIAGNOSIS — E782 Mixed hyperlipidemia: Secondary | ICD-10-CM | POA: Diagnosis not present

## 2016-04-09 DIAGNOSIS — L309 Dermatitis, unspecified: Secondary | ICD-10-CM | POA: Diagnosis not present

## 2016-07-08 DIAGNOSIS — C61 Malignant neoplasm of prostate: Secondary | ICD-10-CM | POA: Diagnosis not present

## 2016-07-15 DIAGNOSIS — Z8546 Personal history of malignant neoplasm of prostate: Secondary | ICD-10-CM | POA: Diagnosis not present

## 2016-07-15 DIAGNOSIS — N5201 Erectile dysfunction due to arterial insufficiency: Secondary | ICD-10-CM | POA: Diagnosis not present

## 2016-09-29 DIAGNOSIS — D0461 Carcinoma in situ of skin of right upper limb, including shoulder: Secondary | ICD-10-CM | POA: Diagnosis not present

## 2016-09-29 DIAGNOSIS — L821 Other seborrheic keratosis: Secondary | ICD-10-CM | POA: Diagnosis not present

## 2016-09-29 DIAGNOSIS — Z23 Encounter for immunization: Secondary | ICD-10-CM | POA: Diagnosis not present

## 2016-09-29 DIAGNOSIS — L570 Actinic keratosis: Secondary | ICD-10-CM | POA: Diagnosis not present

## 2016-10-12 DIAGNOSIS — I341 Nonrheumatic mitral (valve) prolapse: Secondary | ICD-10-CM | POA: Diagnosis not present

## 2016-10-12 DIAGNOSIS — R7309 Other abnormal glucose: Secondary | ICD-10-CM | POA: Diagnosis not present

## 2016-10-12 DIAGNOSIS — M791 Myalgia: Secondary | ICD-10-CM | POA: Diagnosis not present

## 2016-10-12 DIAGNOSIS — K22719 Barrett's esophagus with dysplasia, unspecified: Secondary | ICD-10-CM | POA: Diagnosis not present

## 2016-10-12 DIAGNOSIS — I10 Essential (primary) hypertension: Secondary | ICD-10-CM | POA: Diagnosis not present

## 2016-10-12 DIAGNOSIS — R001 Bradycardia, unspecified: Secondary | ICD-10-CM | POA: Diagnosis not present

## 2016-10-12 DIAGNOSIS — Z0000 Encounter for general adult medical examination without abnormal findings: Secondary | ICD-10-CM | POA: Diagnosis not present

## 2016-10-12 DIAGNOSIS — L309 Dermatitis, unspecified: Secondary | ICD-10-CM | POA: Diagnosis not present

## 2016-10-12 DIAGNOSIS — Z8546 Personal history of malignant neoplasm of prostate: Secondary | ICD-10-CM | POA: Diagnosis not present

## 2016-10-12 DIAGNOSIS — N529 Male erectile dysfunction, unspecified: Secondary | ICD-10-CM | POA: Diagnosis not present

## 2016-10-22 DIAGNOSIS — Z1211 Encounter for screening for malignant neoplasm of colon: Secondary | ICD-10-CM | POA: Diagnosis not present

## 2016-10-26 DIAGNOSIS — E785 Hyperlipidemia, unspecified: Secondary | ICD-10-CM | POA: Diagnosis not present

## 2016-10-26 DIAGNOSIS — I341 Nonrheumatic mitral (valve) prolapse: Secondary | ICD-10-CM | POA: Diagnosis not present

## 2016-10-26 DIAGNOSIS — I10 Essential (primary) hypertension: Secondary | ICD-10-CM | POA: Diagnosis not present

## 2016-10-28 DIAGNOSIS — Z1211 Encounter for screening for malignant neoplasm of colon: Secondary | ICD-10-CM | POA: Diagnosis not present

## 2016-10-28 DIAGNOSIS — K2270 Barrett's esophagus without dysplasia: Secondary | ICD-10-CM | POA: Diagnosis not present

## 2016-11-02 DIAGNOSIS — R011 Cardiac murmur, unspecified: Secondary | ICD-10-CM | POA: Diagnosis not present

## 2016-12-31 DIAGNOSIS — L570 Actinic keratosis: Secondary | ICD-10-CM | POA: Diagnosis not present

## 2016-12-31 DIAGNOSIS — L918 Other hypertrophic disorders of the skin: Secondary | ICD-10-CM | POA: Diagnosis not present

## 2016-12-31 DIAGNOSIS — D485 Neoplasm of uncertain behavior of skin: Secondary | ICD-10-CM | POA: Diagnosis not present

## 2016-12-31 DIAGNOSIS — Z86008 Personal history of in-situ neoplasm of other site: Secondary | ICD-10-CM | POA: Diagnosis not present

## 2017-01-13 DIAGNOSIS — Z8546 Personal history of malignant neoplasm of prostate: Secondary | ICD-10-CM | POA: Diagnosis not present

## 2017-01-20 DIAGNOSIS — Z8546 Personal history of malignant neoplasm of prostate: Secondary | ICD-10-CM | POA: Diagnosis not present

## 2017-02-03 DIAGNOSIS — H2513 Age-related nuclear cataract, bilateral: Secondary | ICD-10-CM | POA: Diagnosis not present

## 2017-02-03 DIAGNOSIS — H5203 Hypermetropia, bilateral: Secondary | ICD-10-CM | POA: Diagnosis not present

## 2017-02-03 DIAGNOSIS — H52223 Regular astigmatism, bilateral: Secondary | ICD-10-CM | POA: Diagnosis not present

## 2017-02-03 DIAGNOSIS — H524 Presbyopia: Secondary | ICD-10-CM | POA: Diagnosis not present

## 2017-03-23 DIAGNOSIS — Z85828 Personal history of other malignant neoplasm of skin: Secondary | ICD-10-CM | POA: Diagnosis not present

## 2017-03-23 DIAGNOSIS — D3610 Benign neoplasm of peripheral nerves and autonomic nervous system, unspecified: Secondary | ICD-10-CM | POA: Diagnosis not present

## 2017-03-23 DIAGNOSIS — L821 Other seborrheic keratosis: Secondary | ICD-10-CM | POA: Diagnosis not present

## 2017-03-23 DIAGNOSIS — L905 Scar conditions and fibrosis of skin: Secondary | ICD-10-CM | POA: Diagnosis not present

## 2017-04-09 DIAGNOSIS — R7303 Prediabetes: Secondary | ICD-10-CM | POA: Diagnosis not present

## 2017-04-09 DIAGNOSIS — I10 Essential (primary) hypertension: Secondary | ICD-10-CM | POA: Diagnosis not present

## 2017-04-09 DIAGNOSIS — I341 Nonrheumatic mitral (valve) prolapse: Secondary | ICD-10-CM | POA: Diagnosis not present

## 2017-04-09 DIAGNOSIS — K22719 Barrett's esophagus with dysplasia, unspecified: Secondary | ICD-10-CM | POA: Diagnosis not present

## 2017-08-12 DIAGNOSIS — Z23 Encounter for immunization: Secondary | ICD-10-CM | POA: Diagnosis not present

## 2017-08-25 DIAGNOSIS — Z8546 Personal history of malignant neoplasm of prostate: Secondary | ICD-10-CM | POA: Diagnosis not present

## 2017-09-01 DIAGNOSIS — N5201 Erectile dysfunction due to arterial insufficiency: Secondary | ICD-10-CM | POA: Diagnosis not present

## 2017-09-01 DIAGNOSIS — Z8546 Personal history of malignant neoplasm of prostate: Secondary | ICD-10-CM | POA: Diagnosis not present

## 2017-09-10 DIAGNOSIS — Z1211 Encounter for screening for malignant neoplasm of colon: Secondary | ICD-10-CM | POA: Diagnosis not present

## 2017-09-10 DIAGNOSIS — Z0000 Encounter for general adult medical examination without abnormal findings: Secondary | ICD-10-CM | POA: Diagnosis not present

## 2017-11-29 DIAGNOSIS — Z1211 Encounter for screening for malignant neoplasm of colon: Secondary | ICD-10-CM | POA: Diagnosis not present

## 2017-11-29 DIAGNOSIS — Z0000 Encounter for general adult medical examination without abnormal findings: Secondary | ICD-10-CM | POA: Diagnosis not present

## 2017-11-29 DIAGNOSIS — I10 Essential (primary) hypertension: Secondary | ICD-10-CM | POA: Diagnosis not present

## 2017-11-29 DIAGNOSIS — I341 Nonrheumatic mitral (valve) prolapse: Secondary | ICD-10-CM | POA: Diagnosis not present

## 2017-11-29 DIAGNOSIS — R7303 Prediabetes: Secondary | ICD-10-CM | POA: Diagnosis not present

## 2017-12-02 DIAGNOSIS — E875 Hyperkalemia: Secondary | ICD-10-CM | POA: Diagnosis not present

## 2017-12-20 DIAGNOSIS — E875 Hyperkalemia: Secondary | ICD-10-CM | POA: Diagnosis not present

## 2017-12-24 DIAGNOSIS — R682 Dry mouth, unspecified: Secondary | ICD-10-CM | POA: Diagnosis not present

## 2018-02-04 DIAGNOSIS — H2513 Age-related nuclear cataract, bilateral: Secondary | ICD-10-CM | POA: Diagnosis not present

## 2018-03-02 DIAGNOSIS — Z8546 Personal history of malignant neoplasm of prostate: Secondary | ICD-10-CM | POA: Diagnosis not present

## 2018-03-02 DIAGNOSIS — N5201 Erectile dysfunction due to arterial insufficiency: Secondary | ICD-10-CM | POA: Diagnosis not present

## 2018-03-23 DIAGNOSIS — L821 Other seborrheic keratosis: Secondary | ICD-10-CM | POA: Diagnosis not present

## 2018-03-23 DIAGNOSIS — Z85828 Personal history of other malignant neoplasm of skin: Secondary | ICD-10-CM | POA: Diagnosis not present

## 2018-03-23 DIAGNOSIS — L409 Psoriasis, unspecified: Secondary | ICD-10-CM | POA: Diagnosis not present

## 2018-03-23 DIAGNOSIS — M2550 Pain in unspecified joint: Secondary | ICD-10-CM | POA: Diagnosis not present

## 2018-04-19 DIAGNOSIS — H2511 Age-related nuclear cataract, right eye: Secondary | ICD-10-CM | POA: Diagnosis not present

## 2018-04-19 DIAGNOSIS — H2513 Age-related nuclear cataract, bilateral: Secondary | ICD-10-CM | POA: Diagnosis not present

## 2018-04-20 DIAGNOSIS — M545 Low back pain: Secondary | ICD-10-CM | POA: Diagnosis not present

## 2018-04-20 DIAGNOSIS — L409 Psoriasis, unspecified: Secondary | ICD-10-CM | POA: Diagnosis not present

## 2018-04-20 DIAGNOSIS — M2550 Pain in unspecified joint: Secondary | ICD-10-CM | POA: Diagnosis not present

## 2018-04-20 DIAGNOSIS — M25552 Pain in left hip: Secondary | ICD-10-CM | POA: Diagnosis not present

## 2018-05-12 DIAGNOSIS — M5136 Other intervertebral disc degeneration, lumbar region: Secondary | ICD-10-CM | POA: Diagnosis not present

## 2018-05-24 DIAGNOSIS — M5136 Other intervertebral disc degeneration, lumbar region: Secondary | ICD-10-CM | POA: Diagnosis not present

## 2018-06-01 DIAGNOSIS — M5136 Other intervertebral disc degeneration, lumbar region: Secondary | ICD-10-CM | POA: Diagnosis not present

## 2018-06-13 DIAGNOSIS — Z9841 Cataract extraction status, right eye: Secondary | ICD-10-CM | POA: Diagnosis not present

## 2018-06-13 DIAGNOSIS — Z961 Presence of intraocular lens: Secondary | ICD-10-CM | POA: Diagnosis not present

## 2018-06-13 DIAGNOSIS — H2511 Age-related nuclear cataract, right eye: Secondary | ICD-10-CM | POA: Diagnosis not present

## 2018-06-13 DIAGNOSIS — Z9842 Cataract extraction status, left eye: Secondary | ICD-10-CM | POA: Diagnosis not present

## 2018-06-14 DIAGNOSIS — H2512 Age-related nuclear cataract, left eye: Secondary | ICD-10-CM | POA: Diagnosis not present

## 2018-07-07 DIAGNOSIS — Z961 Presence of intraocular lens: Secondary | ICD-10-CM | POA: Diagnosis not present

## 2018-07-07 DIAGNOSIS — Z9842 Cataract extraction status, left eye: Secondary | ICD-10-CM | POA: Diagnosis not present

## 2018-07-07 DIAGNOSIS — H2512 Age-related nuclear cataract, left eye: Secondary | ICD-10-CM | POA: Diagnosis not present

## 2018-07-07 DIAGNOSIS — Z9841 Cataract extraction status, right eye: Secondary | ICD-10-CM | POA: Diagnosis not present

## 2018-07-08 DIAGNOSIS — H2512 Age-related nuclear cataract, left eye: Secondary | ICD-10-CM | POA: Diagnosis not present

## 2018-09-01 DIAGNOSIS — Z23 Encounter for immunization: Secondary | ICD-10-CM | POA: Diagnosis not present

## 2018-09-08 ENCOUNTER — Telehealth: Payer: Self-pay | Admitting: Internal Medicine

## 2018-09-08 NOTE — Telephone Encounter (Signed)
We have not seen this pt in 3 years since 11/12/15  Jeannine Boga to get clarifying information. Per Estill Bamberg, this is the wrong pt that was pulled up. Will close this encounter.

## 2018-09-14 DIAGNOSIS — Z8546 Personal history of malignant neoplasm of prostate: Secondary | ICD-10-CM | POA: Diagnosis not present

## 2018-09-21 DIAGNOSIS — Z8546 Personal history of malignant neoplasm of prostate: Secondary | ICD-10-CM | POA: Diagnosis not present

## 2018-09-21 DIAGNOSIS — N5201 Erectile dysfunction due to arterial insufficiency: Secondary | ICD-10-CM | POA: Diagnosis not present

## 2018-12-06 DIAGNOSIS — I10 Essential (primary) hypertension: Secondary | ICD-10-CM | POA: Diagnosis not present

## 2018-12-06 DIAGNOSIS — R7303 Prediabetes: Secondary | ICD-10-CM | POA: Diagnosis not present

## 2018-12-06 DIAGNOSIS — I341 Nonrheumatic mitral (valve) prolapse: Secondary | ICD-10-CM | POA: Diagnosis not present

## 2018-12-06 DIAGNOSIS — Z0000 Encounter for general adult medical examination without abnormal findings: Secondary | ICD-10-CM | POA: Diagnosis not present

## 2018-12-09 DIAGNOSIS — Z1211 Encounter for screening for malignant neoplasm of colon: Secondary | ICD-10-CM | POA: Diagnosis not present

## 2018-12-16 DIAGNOSIS — J019 Acute sinusitis, unspecified: Secondary | ICD-10-CM | POA: Diagnosis not present

## 2019-03-24 DIAGNOSIS — Z8546 Personal history of malignant neoplasm of prostate: Secondary | ICD-10-CM | POA: Diagnosis not present

## 2019-03-31 DIAGNOSIS — N5201 Erectile dysfunction due to arterial insufficiency: Secondary | ICD-10-CM | POA: Diagnosis not present

## 2019-03-31 DIAGNOSIS — Z8546 Personal history of malignant neoplasm of prostate: Secondary | ICD-10-CM | POA: Diagnosis not present

## 2019-04-21 DIAGNOSIS — M5432 Sciatica, left side: Secondary | ICD-10-CM | POA: Diagnosis not present

## 2019-05-04 ENCOUNTER — Other Ambulatory Visit: Payer: Self-pay | Admitting: Family Medicine

## 2019-05-04 DIAGNOSIS — M5432 Sciatica, left side: Secondary | ICD-10-CM

## 2019-05-09 ENCOUNTER — Ambulatory Visit
Admission: RE | Admit: 2019-05-09 | Discharge: 2019-05-09 | Disposition: A | Discharge status: Home / Self Care | Payer: Medicare Other | Source: Ambulatory Visit | Attending: Family Medicine | Admitting: Family Medicine

## 2019-05-09 ENCOUNTER — Other Ambulatory Visit: Payer: Self-pay

## 2019-05-09 DIAGNOSIS — M5432 Sciatica, left side: Secondary | ICD-10-CM

## 2019-05-09 DIAGNOSIS — M48061 Spinal stenosis, lumbar region without neurogenic claudication: Secondary | ICD-10-CM | POA: Diagnosis not present

## 2019-05-24 DIAGNOSIS — M5136 Other intervertebral disc degeneration, lumbar region: Secondary | ICD-10-CM | POA: Diagnosis not present

## 2019-07-13 DIAGNOSIS — Z0120 Encounter for dental examination and cleaning without abnormal findings: Secondary | ICD-10-CM | POA: Diagnosis not present

## 2019-07-19 NOTE — Progress Notes (Signed)
Chief Complaint  Patient presents with  . New Patient (Initial Visit)   History of Present Illness: 73 yo male with history of HTN, HLD, prostate cancer, GERD, Barretts esophagus, erectile dysfunction, mitral valve prolapse, mitral regurgitation, aortic valve insufficiency who is here today for cardiac follow up. He has been followed in the past by Dr. Wynonia Lawman. He is intolerant of statins. Echo December 2017 with LVEF=55%. Mild LVH. Mild MR, Mild AI.   He is here today for follow up. The patient denies any chest pain, dyspnea, palpitations, lower extremity edema, orthopnea, PND, dizziness, near syncope or syncope.    Primary Care Physician: Antony Contras, MD  Past Medical History:  Diagnosis Date  . ALLERGIC RHINITIS 08/09/2008   Qualifier: Diagnosis of  By: Doy Mince LPN, Megan    . Arthritis    joints, generalized  . Cancer ([place])    skin cancer excision right arm 2'16  . Duodenal ulcer    80's  . G E R D 08/09/2008   Qualifier: Diagnosis of  By: Doy Mince LPN, Megan    . Heart murmur   . Hemorrhoids   . Hx of gastric ulcer   . HYPERLIPIDEMIA 08/09/2008   Qualifier: Diagnosis of  By: Doy Mince LPN, Megan    . Hypertension   . HYPERTENSION 08/09/2008   Qualifier: Diagnosis of  By: Doy Mince LPN, Megan    . LOW HDL 08/09/2008   Qualifier: Diagnosis of  By: Doy Mince LPN, Megan    . Mitral valve prolapse   . MITRAL VALVE PROLAPSE 08/09/2008   Qualifier: Diagnosis of  By: Doy Mince LPN, Megan    . Multiple pulmonary nodules 02/12/2015   Followed in Pulmonary clinic/ [facility]/ Wert/ never smoker - See CT chest Jan 18 2015 [facility] Multiple tree in bud nodules plus one macrolobulated nodule 1.4 cm LUL  - f/u ov 11/12/2015 no change/ no symtoms > f/u prn    . Prostate cancer ([place]) 01/24/2015    Past Surgical History:  Procedure Laterality Date  . CARDIAC CATHETERIZATION     '61-[facility] age 45, evaluate heart murmur-dx."MVP"  . HERNIA REPAIR     laparoscopic  .  LYMPHADENECTOMY Bilateral 01/24/2015   Procedure: LYMPHADENECTOMY;  Surgeon: Raynelle Bring, MD;  Location: [facility];  Service: Urology;  Laterality: Bilateral;  . PROSTATE BIOPSY  01/24/2015  . ROBOT ASSISTED LAPAROSCOPIC RADICAL PROSTATECTOMY N/A 01/24/2015   Procedure: ROBOTIC ASSISTED LAPAROSCOPIC RADICAL PROSTATECTOMY LEVEL 3;  Surgeon: Raynelle Bring, MD;  Location: [facility];  Service: Urology;  Laterality: N/A;  . SALIVARY STONE REMOVAL      Current Outpatient Medications  Medication Sig Dispense Refill  . acetaminophen (TYLENOL) 500 MG tablet Take 500-1,000 mg by mouth every 6 (six) hours as needed for moderate pain or headache.    . Calcium 600-200 MG-UNIT tablet Take 1 tablet by mouth daily.    . chlorpheniramine (CHLOR-TRIMETON) 4 MG tablet Take 4 mg by mouth 2 (two) times daily as needed for allergies.    . Cholecalciferol (VITAMIN D) 2000 UNITS CAPS Take 1 capsule by mouth daily.    Marland Kitchen CINNAMON PO Take 1 Can by mouth daily.    . fluticasone (FLONASE) 50 MCG/ACT nasal spray Place 2 sprays into both nostrils daily as needed for allergies or rhinitis.    . Glucosamine HCl-MSM (GLUCOSAMINE-MSM PO) Take 1 tablet by mouth daily.    . hydrocortisone cream 1 % Apply 1 application topically daily as needed for itching.    . Magnesium Oxide (MAG-OX  400 PO) Take 1 tablet by mouth daily.    . Misc Natural Products (OSTEO BI-FLEX ADV JOINT SHIELD) TABS Take 2 tablets by mouth daily.    Marland Kitchen olmesartan (BENICAR) 20 MG tablet Take 20 mg by mouth daily with lunch.     . Omega-3 Fatty Acids (FISH OIL) 1000 MG CAPS Take 1 capsule by mouth daily.    . Psyllium (METAMUCIL PO) Take 3 scoop by mouth every morning.    . sildenafil (REVATIO) 20 MG tablet Take 20 mg by mouth daily as needed.    . Turmeric 500 MG CAPS Take 1 Can by mouth daily.    . vitamin B-12 (CYANOCOBALAMIN) 500 MCG tablet Take 500 mcg by mouth daily.     No current facility-administered medications for this visit.     Allergies  Allergen  Reactions  . Celebrex [Celecoxib] Other (See Comments)    Stomach distress + ulcer.  . Crestor [Rosuvastatin] Other (See Comments)    Sore muscle. + Pravastatin   . Diltiazem     Heart pounding, low pulse.   . Ramipril Other (See Comments)    Heart pounding, sleeplessness  . Reglan [Metoclopramide] Other (See Comments)    Sleeplessness, Jittery, "felt like crawling out of skin"  . Losartan Rash    Didn't work   . Tetracyclines & Related Rash    Social History   Socioeconomic History  . Marital status: Married    Spouse name: Not on file  . Number of children: 3  . Years of education: Not on file  . Highest education level: Not on file  Occupational History  . Occupation: Clinical biochemist  Social Needs  . Financial resource strain: Not on file  . Food insecurity    Worry: Not on file    Inability: Not on file  . Transportation needs    Medical: Not on file    Non-medical: Not on file  Tobacco Use  . Smoking status: Never Smoker  . Smokeless tobacco: Never Used  Substance and Sexual Activity  . Alcohol use: No  . Drug use: No  . Sexual activity: Not on file  Lifestyle  . Physical activity    Days per week: Not on file    Minutes per session: Not on file  . Stress: Not on file  Relationships  . Social Herbalist on phone: Not on file    Gets together: Not on file    Attends religious service: Not on file    Active member of club or organization: Not on file    Attends meetings of clubs or organizations: Not on file    Relationship status: Not on file  . Intimate partner violence    Fear of current or ex partner: Not on file    Emotionally abused: Not on file    Physically abused: Not on file    Forced sexual activity: Not on file  Other Topics Concern  . Not on file  Social History Narrative  . Not on file    History reviewed. No pertinent family history.  Review of Systems:  As stated in the HPI and otherwise negative.   BP (!) 146/72    Pulse (!) 57   Ht 6' (1.829 m)   Wt 132 lb 6.4 oz (60.1 kg)   SpO2 97%   BMI 17.96 kg/m   Physical Examination: General: Well developed, well nourished, NAD  HEENT: OP clear, mucus membranes moist  SKIN: warm, dry. No rashes.  Neuro: No focal deficits  Musculoskeletal: Muscle strength 5/5 all ext  Psychiatric: Mood and affect normal  Neck: No JVD, no carotid bruits, no thyromegaly, no lymphadenopathy.  Lungs:Clear bilaterally, no wheezes, rhonci, crackles Cardiovascular: Regular rate and rhythm. No murmurs, gallops or rubs. Abdomen:Soft. Bowel sounds present. Non-tender.  Extremities: No lower extremity edema. Pulses are 2 + in the bilateral DP/PT.  EKG:  EKG is ordered today. The ekg ordered today demonstrates sinus bradycardia, rate 57 bpm. LVH  Recent Labs: No results found for requested labs within last 8760 hours.   Lipid Panel No results found for: CHOL, TRIG, HDL, CHOLHDL, VLDL, LDLCALC, LDLDIRECT   Wt Readings from Last 3 Encounters:  07/20/19 132 lb 6.4 oz (60.1 kg)  11/12/15 149 lb (67.6 kg)  07/18/15 146 lb 1.6 oz (66.3 kg)     Assessment and Plan:   1. Mitral valve insufficiency: Mild by echo in 2017. Will repeat echo now.   2. Aortic valve insufficiency: Mild by echo in 2017. Repeat echo now.   3. HTN: BP is controlled at home.   Current medicines are reviewed at length with the patient today.  The patient does not have concerns regarding medicines.  The following changes have been made:  no change  Labs/ tests ordered today include:   Orders Placed This Encounter  Procedures  . EKG 12-Lead  . ECHOCARDIOGRAM COMPLETE   Disposition:   FU with me in one year.   Signed, Lauree Chandler, MD 07/20/2019 3:58 PM    [facility] [place], [place], [place]  [address] Phone: [phone]; Fax: [phone]

## 2019-07-20 ENCOUNTER — Ambulatory Visit (INDEPENDENT_AMBULATORY_CARE_PROVIDER_SITE_OTHER): Payer: Medicare Other | Admitting: Cardiovascular Disease

## 2019-07-20 ENCOUNTER — Encounter: Payer: Self-pay | Admitting: Cardiovascular Disease

## 2019-07-20 ENCOUNTER — Other Ambulatory Visit: Payer: Self-pay

## 2019-07-20 VITALS — BP 146/72 | HR 57 | Ht 72.0 in | Wt 132.4 lb

## 2019-07-20 DIAGNOSIS — I10 Essential (primary) hypertension: Secondary | ICD-10-CM

## 2019-07-20 DIAGNOSIS — I351 Nonrheumatic aortic (valve) insufficiency: Secondary | ICD-10-CM

## 2019-07-20 DIAGNOSIS — I340 Nonrheumatic mitral (valve) insufficiency: Secondary | ICD-10-CM

## 2019-07-20 NOTE — Patient Instructions (Signed)
Medication Instructions:  Your physician recommends that you continue on your current medications as directed. Please refer to the Current Medication list given to you today.  If you need a refill on your cardiac medications before your next appointment, please call your pharmacy.   Lab work: None If you have labs (blood work) drawn today and your tests are completely normal, you will receive your results only by: Marland Kitchen MyChart Message (if you have MyChart) OR . A paper copy in the mail If you have any lab test that is abnormal or we need to change your treatment, we will call you to review the results.  Testing/Procedures: Your physician has requested that you have an echocardiogram. Echocardiography is a painless test that uses sound waves to create images of your heart. It provides your doctor with information about the size and shape of your heart and how well your heart's chambers and valves are working. This procedure takes approximately one hour. There are no restrictions for this procedure.   Follow-Up: At [facility], you and your health needs are our priority.  As part of our continuing mission to provide you with exceptional heart care, we have created designated Provider Care Teams.  These Care Teams include your primary Cardiologist (physician) and Advanced Practice Providers (APPs -  Physician Assistants and Nurse Practitioners) who all work together to provide you with the care you need, when you need it. You will need a follow up appointment in 12 months.  Please call our office 2 months in advance to schedule this appointment.  You may see Dr. Lauree Chandler or one of the following Advanced Practice Providers on your designated Care Team:   [place], PA-C Melina Copa, PA-C . Ermalinda Barrios, PA-C  Any Other Special Instructions Will Be Listed Below (If Applicable).

## 2019-08-09 ENCOUNTER — Ambulatory Visit (HOSPITAL_COMMUNITY): Payer: Medicare Other | Attending: Cardiology

## 2019-08-09 ENCOUNTER — Other Ambulatory Visit: Payer: Self-pay

## 2019-08-09 DIAGNOSIS — I351 Nonrheumatic aortic (valve) insufficiency: Secondary | ICD-10-CM | POA: Diagnosis not present

## 2019-08-09 DIAGNOSIS — I340 Nonrheumatic mitral (valve) insufficiency: Secondary | ICD-10-CM | POA: Diagnosis not present

## 2019-08-30 DIAGNOSIS — Z23 Encounter for immunization: Secondary | ICD-10-CM | POA: Diagnosis not present

## 2019-09-07 DIAGNOSIS — H524 Presbyopia: Secondary | ICD-10-CM | POA: Diagnosis not present

## 2019-10-25 DIAGNOSIS — Z8546 Personal history of malignant neoplasm of prostate: Secondary | ICD-10-CM | POA: Diagnosis not present

## 2019-11-01 DIAGNOSIS — Z8546 Personal history of malignant neoplasm of prostate: Secondary | ICD-10-CM | POA: Diagnosis not present

## 2019-12-19 ENCOUNTER — Ambulatory Visit: Payer: Medicare Other

## 2019-12-28 ENCOUNTER — Ambulatory Visit: Payer: Medicare Other | Attending: Internal Medicine

## 2019-12-28 DIAGNOSIS — Z23 Encounter for immunization: Secondary | ICD-10-CM | POA: Insufficient documentation

## 2019-12-28 NOTE — Progress Notes (Signed)
   Covid-19 Vaccination Clinic  Name:  Kenneth Weaver    MRN: [id number] DOB: [birth year]  12/28/2019  Kenneth Weaver was observed post Covid-19 immunization for 15 minutes without incidence. He was provided with Vaccine Information Sheet and instruction to access the V-Safe system.   Kenneth Weaver was instructed to call 911 with any severe reactions post vaccine: Marland Kitchen Difficulty breathing  . Swelling of your face and throat  . A fast heartbeat  . A bad rash all over your body  . Dizziness and weakness    Immunizations Administered    Name Date Dose VIS Date Route   Pfizer COVID-19 Vaccine 12/28/2019  2:00 PM 0.3 mL 11/03/2019 Intramuscular   Manufacturer: [place]   Lot: [id number]   [place]: [id number]

## 2020-01-02 DIAGNOSIS — R7303 Prediabetes: Secondary | ICD-10-CM | POA: Diagnosis not present

## 2020-01-02 DIAGNOSIS — Z0000 Encounter for general adult medical examination without abnormal findings: Secondary | ICD-10-CM | POA: Diagnosis not present

## 2020-01-02 DIAGNOSIS — I341 Nonrheumatic mitral (valve) prolapse: Secondary | ICD-10-CM | POA: Diagnosis not present

## 2020-01-02 DIAGNOSIS — I10 Essential (primary) hypertension: Secondary | ICD-10-CM | POA: Diagnosis not present

## 2020-01-08 DIAGNOSIS — Z1211 Encounter for screening for malignant neoplasm of colon: Secondary | ICD-10-CM | POA: Diagnosis not present

## 2020-01-09 ENCOUNTER — Ambulatory Visit: Payer: Medicare Other

## 2020-01-22 ENCOUNTER — Ambulatory Visit: Payer: Medicare Other | Attending: Internal Medicine

## 2020-01-22 DIAGNOSIS — Z23 Encounter for immunization: Secondary | ICD-10-CM

## 2020-01-22 NOTE — Progress Notes (Signed)
   Covid-19 Vaccination Clinic  Name:  Kenneth Weaver    MRN: [id number] DOB: [birth year]  01/22/2020  Kenneth Weaver was observed post Covid-19 immunization for 15 minutes without incidence. He was provided with Vaccine Information Sheet and instruction to access the V-Safe system.   Kenneth Weaver was instructed to call 911 with any severe reactions post vaccine: Marland Kitchen Difficulty breathing  . Swelling of your face and throat  . A fast heartbeat  . A bad rash all over your body  . Dizziness and weakness    Immunizations Administered    Name Date Dose VIS Date Route   Pfizer COVID-19 Vaccine 01/22/2020  4:39 PM 0.3 mL 11/03/2019 Intramuscular   Manufacturer: [place]   Lot: [id number]   [place]: [id number]

## 2020-03-27 DIAGNOSIS — L578 Other skin changes due to chronic exposure to nonionizing radiation: Secondary | ICD-10-CM | POA: Diagnosis not present

## 2020-03-27 DIAGNOSIS — Z85828 Personal history of other malignant neoplasm of skin: Secondary | ICD-10-CM | POA: Diagnosis not present

## 2020-03-27 DIAGNOSIS — L821 Other seborrheic keratosis: Secondary | ICD-10-CM | POA: Diagnosis not present

## 2020-03-27 DIAGNOSIS — D225 Melanocytic nevi of trunk: Secondary | ICD-10-CM | POA: Diagnosis not present

## 2020-05-01 DIAGNOSIS — Z8546 Personal history of malignant neoplasm of prostate: Secondary | ICD-10-CM | POA: Diagnosis not present

## 2020-05-08 DIAGNOSIS — Z8546 Personal history of malignant neoplasm of prostate: Secondary | ICD-10-CM | POA: Diagnosis not present

## 2020-05-09 DIAGNOSIS — Z0120 Encounter for dental examination and cleaning without abnormal findings: Secondary | ICD-10-CM | POA: Diagnosis not present

## 2020-07-01 DIAGNOSIS — I10 Essential (primary) hypertension: Secondary | ICD-10-CM | POA: Diagnosis not present

## 2020-07-01 DIAGNOSIS — E7800 Pure hypercholesterolemia, unspecified: Secondary | ICD-10-CM | POA: Diagnosis not present

## 2020-07-01 DIAGNOSIS — M48061 Spinal stenosis, lumbar region without neurogenic claudication: Secondary | ICD-10-CM | POA: Diagnosis not present

## 2020-07-01 DIAGNOSIS — R7303 Prediabetes: Secondary | ICD-10-CM | POA: Diagnosis not present

## 2020-07-16 DIAGNOSIS — H5203 Hypermetropia, bilateral: Secondary | ICD-10-CM | POA: Diagnosis not present

## 2020-08-23 DIAGNOSIS — Z23 Encounter for immunization: Secondary | ICD-10-CM | POA: Diagnosis not present

## 2020-09-09 ENCOUNTER — Ambulatory Visit: Payer: Medicare Other | Admitting: Cardiovascular Disease

## 2020-09-09 ENCOUNTER — Encounter: Payer: Self-pay | Admitting: Cardiovascular Disease

## 2020-09-09 ENCOUNTER — Other Ambulatory Visit: Payer: Self-pay

## 2020-09-09 VITALS — BP 126/70 | HR 56 | Ht 72.0 in | Wt 157.6 lb

## 2020-09-09 DIAGNOSIS — I10 Essential (primary) hypertension: Secondary | ICD-10-CM | POA: Diagnosis not present

## 2020-09-09 DIAGNOSIS — I340 Nonrheumatic mitral (valve) insufficiency: Secondary | ICD-10-CM | POA: Diagnosis not present

## 2020-09-09 DIAGNOSIS — I351 Nonrheumatic aortic (valve) insufficiency: Secondary | ICD-10-CM | POA: Diagnosis not present

## 2020-09-09 NOTE — Patient Instructions (Signed)

## 2020-09-09 NOTE — Progress Notes (Signed)
Chief Complaint  Patient presents with  . Follow-up    mitral valve disease   History of Present Illness: 74 yo male with history of HTN, HLD, prostate cancer, GERD, Barretts esophagus, erectile dysfunction, mitral valve prolapse, mitral regurgitation, aortic valve insufficiency who is here today for cardiac follow up. He has been followed in the past by Dr. Wynonia Lawman. He is intolerant of statins. Echo December 2017 with LVEF=55%. Mild LVH. Mild MR, Mild AI. I met him in August 2020. Last echo September 2020 with LVEF=60-65%, mild MVP with no MR, mild AI.   He is here today for follow up. The patient denies any chest pain, dyspnea, palpitations, lower extremity edema, orthopnea, PND, dizziness, near syncope or syncope.    Primary Care Physician: Antony Contras, MD  Past Medical History:  Diagnosis Date  . ALLERGIC RHINITIS 08/09/2008   Qualifier: Diagnosis of  By: Doy Mince LPN, Megan    . Arthritis    joints, generalized  . Cancer ([place])    skin cancer excision right arm 2'16  . Duodenal ulcer    80's  . G E R D 08/09/2008   Qualifier: Diagnosis of  By: Doy Mince LPN, Megan    . Heart murmur   . Hemorrhoids   . Hx of gastric ulcer   . HYPERLIPIDEMIA 08/09/2008   Qualifier: Diagnosis of  By: Doy Mince LPN, Megan    . Hypertension   . HYPERTENSION 08/09/2008   Qualifier: Diagnosis of  By: Doy Mince LPN, Megan    . LOW HDL 08/09/2008   Qualifier: Diagnosis of  By: Doy Mince LPN, Megan    . Mitral valve prolapse   . MITRAL VALVE PROLAPSE 08/09/2008   Qualifier: Diagnosis of  By: Doy Mince LPN, Megan    . Multiple pulmonary nodules 02/12/2015   Followed in Pulmonary clinic/ [facility]/ [place]/ never smoker - See CT chest Jan 18 2015 [facility] Multiple tree in bud nodules plus one macrolobulated nodule 1.4 cm LUL  - f/u ov 11/12/2015 no change/ no symtoms > f/u prn    . Prostate cancer ([place]) 01/24/2015    Past Surgical History:  Procedure Laterality Date  . CARDIAC  CATHETERIZATION     '61-[facility] age 17, evaluate heart murmur-dx."MVP"  . HERNIA REPAIR     laparoscopic  . LYMPHADENECTOMY Bilateral 01/24/2015   Procedure: LYMPHADENECTOMY;  Surgeon: Raynelle Bring, MD;  Location: [facility];  Service: Urology;  Laterality: Bilateral;  . PROSTATE BIOPSY  01/24/2015  . ROBOT ASSISTED LAPAROSCOPIC RADICAL PROSTATECTOMY N/A 01/24/2015   Procedure: ROBOTIC ASSISTED LAPAROSCOPIC RADICAL PROSTATECTOMY LEVEL 3;  Surgeon: Raynelle Bring, MD;  Location: [facility];  Service: Urology;  Laterality: N/A;  . SALIVARY STONE REMOVAL      Current Outpatient Medications  Medication Sig Dispense Refill  . acetaminophen (TYLENOL) 500 MG tablet Take 500-1,000 mg by mouth every 6 (six) hours as needed for moderate pain or headache.    . Calcium 600-200 MG-UNIT tablet Take 1 tablet by mouth daily.    . chlorpheniramine (CHLOR-TRIMETON) 4 MG tablet Take 4 mg by mouth 2 (two) times daily as needed for allergies.    . Cholecalciferol (VITAMIN D) 2000 UNITS CAPS Take 1 capsule by mouth daily.    Marland Kitchen CINNAMON PO Take 1 Can by mouth daily.    . Glucosamine HCl-MSM (GLUCOSAMINE-MSM PO) Take 1 tablet by mouth daily.    . hydrocortisone cream 1 % Apply 1 application topically daily as needed for itching.    . Magnesium Oxide (MAG-OX  400 PO) Take 1 tablet by mouth daily.    . Misc Natural Products (OSTEO BI-FLEX ADV JOINT SHIELD) TABS Take 2 tablets by mouth daily.    Marland Kitchen olmesartan (BENICAR) 20 MG tablet Take 20 mg by mouth daily with lunch.     . Omega-3 Fatty Acids (FISH OIL) 1000 MG CAPS Take 1 capsule by mouth daily.    . Psyllium (METAMUCIL PO) Take 3 scoop by mouth every morning.    . sildenafil (REVATIO) 20 MG tablet Take 20 mg by mouth daily as needed.    . Turmeric 500 MG CAPS Take 1 Can by mouth daily.    . vitamin B-12 (CYANOCOBALAMIN) 500 MCG tablet Take 500 mcg by mouth daily.     No current facility-administered medications for this visit.    Allergies  Allergen Reactions  . Celebrex  [Celecoxib] Other (See Comments)    Stomach distress + ulcer.  . Crestor [Rosuvastatin] Other (See Comments)    Sore muscle. + Pravastatin   . Diltiazem     Heart pounding, low pulse.   . Ramipril Other (See Comments)    Heart pounding, sleeplessness  . Reglan [Metoclopramide] Other (See Comments)    Sleeplessness, Jittery, "felt like crawling out of skin"  . Losartan Rash    Didn't work   . Tetracyclines & Related Rash    Social History   Socioeconomic History  . Marital status: Married    Spouse name: Not on file  . Number of children: 3  . Years of education: Not on file  . Highest education level: Not on file  Occupational History  . Occupation: Retired-[employer]  Tobacco Use  . Smoking status: Never Smoker  . Smokeless tobacco: Never Used  Substance and Sexual Activity  . Alcohol use: No  . Drug use: No  . Sexual activity: Not on file  Other Topics Concern  . Not on file  Social History Narrative  . Not on file   Social Determinants of Health   Financial Resource Strain:   . Difficulty of Paying Living Expenses: Not on file  Food Insecurity:   . Worried About Charity fundraiser in the Last Year: Not on file  . Ran Out of Food in the Last Year: Not on file  Transportation Needs:   . Lack of Transportation (Medical): Not on file  . Lack of Transportation (Non-Medical): Not on file  Physical Activity:   . Days of Exercise per Week: Not on file  . Minutes of Exercise per Session: Not on file  Stress:   . Feeling of Stress : Not on file  Social Connections:   . Frequency of Communication with Friends and Family: Not on file  . Frequency of Social Gatherings with Friends and Family: Not on file  . Attends Religious Services: Not on file  . Active Member of Clubs or Organizations: Not on file  . Attends Archivist Meetings: Not on file  . Marital Status: Not on file  Intimate Partner Violence:   . Fear of Current or Ex-Partner: Not on file  .  Emotionally Abused: Not on file  . Physically Abused: Not on file  . Sexually Abused: Not on file    History reviewed. No pertinent family history.  Review of Systems:  As stated in the HPI and otherwise negative.   BP 126/70   Pulse (!) 56   Ht 6' (1.829 m)   Wt 157 lb 9.6 oz (71.5 kg)   SpO2 98%  BMI 21.37 kg/m   Physical Examination:  General: Well developed, well nourished, NAD  HEENT: OP clear, mucus membranes moist  SKIN: warm, dry. No rashes. Neuro: No focal deficits  Musculoskeletal: Muscle strength 5/5 all ext  Psychiatric: Mood and affect normal  Neck: No JVD, no carotid bruits, no thyromegaly, no lymphadenopathy.  Lungs:Clear bilaterally, no wheezes, rhonci, crackles Cardiovascular: Regular rate and rhythm. No murmurs, gallops or rubs. Abdomen:Soft. Bowel sounds present. Non-tender.  Extremities: No lower extremity edema. Pulses are 2 + in the bilateral DP/PT.  EKG:  EKG is ordered today. The ekg ordered today demonstrates sinus bradycardia, rate 56 bpm. PAC  Echo September 2020:  1. The left ventricle has normal systolic function with an ejection  fraction of 60-65%. The cavity size was normal. Left ventricular diastolic  Doppler parameters are indeterminate.  2. The right ventricle has normal systolic function. The cavity was  mildly enlarged. There is No increase in right ventricular wall thickness.  3. Left atrial size was normal.  4. Right atrial size was normal.  5. Mild mitral valve prolapse.  6. The mitral valve is myxomatous. mild thickening of the mitral valve  leaflet. No evidence of mitral valve stenosis.  7. The aortic valve is tricuspid. Mild thickening of the aortic valve.  Mild calcification of the aortic valve. Aortic valve regurgitation is mild  by color flow Doppler.  8. Pulmonic valve regurgitation is mild by color flow Doppler.  9. The aorta is normal unless otherwise noted.   Recent Labs: No results found for requested  labs within last 8760 hours.   Lipid Panel No results found for: CHOL, TRIG, HDL, CHOLHDL, VLDL, LDLCALC, LDLDIRECT   Wt Readings from Last 3 Encounters:  09/09/20 157 lb 9.6 oz (71.5 kg)  07/20/19 132 lb 6.4 oz (60.1 kg)  11/12/15 149 lb (67.6 kg)     Assessment and Plan:   1. Mitral valve insufficiency: No significant insufficiency by echo in September 2020  2. Aortic valve insufficiency: Mild by echo in September 2020. Repeat echo September 2023.   3. HTN: BP is well controlled. No changes  Current medicines are reviewed at length with the patient today.  The patient does not have concerns regarding medicines.  The following changes have been made:  no change  Labs/ tests ordered today include:   No orders of the defined types were placed in this encounter.  Disposition:   FU with me in one year.   Signed, Lauree Chandler, MD 09/09/2020 3:42 PM    [facility] [address], [place], [place]  [address] Phone: [phone]; Fax: [phone]

## 2020-09-11 NOTE — Addendum Note (Signed)
Addended by: Mendel Ryder on: 09/11/2020 04:15 PM   Modules accepted: Orders

## 2020-10-11 DIAGNOSIS — M5416 Radiculopathy, lumbar region: Secondary | ICD-10-CM | POA: Diagnosis not present

## 2020-10-11 DIAGNOSIS — M48062 Spinal stenosis, lumbar region with neurogenic claudication: Secondary | ICD-10-CM | POA: Diagnosis not present

## 2020-11-07 DIAGNOSIS — M5416 Radiculopathy, lumbar region: Secondary | ICD-10-CM | POA: Diagnosis not present

## 2020-11-23 HISTORY — PX: TEAR DUCT PROBING: SHX793

## 2020-12-12 DIAGNOSIS — H04203 Unspecified epiphora, bilateral lacrimal glands: Secondary | ICD-10-CM | POA: Diagnosis not present

## 2020-12-12 DIAGNOSIS — H5203 Hypermetropia, bilateral: Secondary | ICD-10-CM | POA: Diagnosis not present

## 2020-12-12 DIAGNOSIS — H52223 Regular astigmatism, bilateral: Secondary | ICD-10-CM | POA: Diagnosis not present

## 2020-12-12 DIAGNOSIS — H04223 Epiphora due to insufficient drainage, bilateral lacrimal glands: Secondary | ICD-10-CM | POA: Diagnosis not present

## 2021-01-06 DIAGNOSIS — M48062 Spinal stenosis, lumbar region with neurogenic claudication: Secondary | ICD-10-CM | POA: Diagnosis not present

## 2021-01-06 DIAGNOSIS — M5416 Radiculopathy, lumbar region: Secondary | ICD-10-CM | POA: Diagnosis not present

## 2021-01-06 DIAGNOSIS — M5136 Other intervertebral disc degeneration, lumbar region: Secondary | ICD-10-CM | POA: Diagnosis not present

## 2021-01-13 DIAGNOSIS — R7303 Prediabetes: Secondary | ICD-10-CM | POA: Diagnosis not present

## 2021-01-13 DIAGNOSIS — Z0000 Encounter for general adult medical examination without abnormal findings: Secondary | ICD-10-CM | POA: Diagnosis not present

## 2021-01-13 DIAGNOSIS — M48061 Spinal stenosis, lumbar region without neurogenic claudication: Secondary | ICD-10-CM | POA: Diagnosis not present

## 2021-01-13 DIAGNOSIS — I10 Essential (primary) hypertension: Secondary | ICD-10-CM | POA: Diagnosis not present

## 2021-01-20 DIAGNOSIS — Z1211 Encounter for screening for malignant neoplasm of colon: Secondary | ICD-10-CM | POA: Diagnosis not present

## 2021-01-22 DIAGNOSIS — H019 Unspecified inflammation of eyelid: Secondary | ICD-10-CM | POA: Diagnosis not present

## 2021-01-22 DIAGNOSIS — H02413 Mechanical ptosis of bilateral eyelids: Secondary | ICD-10-CM | POA: Diagnosis not present

## 2021-01-22 DIAGNOSIS — H02831 Dermatochalasis of right upper eyelid: Secondary | ICD-10-CM | POA: Diagnosis not present

## 2021-01-22 DIAGNOSIS — H57813 Brow ptosis, bilateral: Secondary | ICD-10-CM | POA: Diagnosis not present

## 2021-02-25 DIAGNOSIS — M5416 Radiculopathy, lumbar region: Secondary | ICD-10-CM | POA: Diagnosis not present

## 2021-03-06 DIAGNOSIS — Z01818 Encounter for other preprocedural examination: Secondary | ICD-10-CM | POA: Diagnosis not present

## 2021-03-10 ENCOUNTER — Other Ambulatory Visit: Payer: Self-pay | Admitting: Ophthalmology

## 2021-03-10 DIAGNOSIS — H04222 Epiphora due to insufficient drainage, left lacrimal gland: Secondary | ICD-10-CM | POA: Diagnosis not present

## 2021-03-10 DIAGNOSIS — H04552 Acquired stenosis of left nasolacrimal duct: Secondary | ICD-10-CM | POA: Diagnosis not present

## 2021-03-10 DIAGNOSIS — H019 Unspecified inflammation of eyelid: Secondary | ICD-10-CM | POA: Diagnosis not present

## 2021-03-10 DIAGNOSIS — H04223 Epiphora due to insufficient drainage, bilateral lacrimal glands: Secondary | ICD-10-CM | POA: Diagnosis not present

## 2021-03-10 DIAGNOSIS — H04412 Chronic dacryocystitis of left lacrimal passage: Secondary | ICD-10-CM | POA: Diagnosis not present

## 2021-03-10 DIAGNOSIS — H04553 Acquired stenosis of bilateral nasolacrimal duct: Secondary | ICD-10-CM | POA: Diagnosis not present

## 2021-03-10 DIAGNOSIS — H02413 Mechanical ptosis of bilateral eyelids: Secondary | ICD-10-CM | POA: Diagnosis not present

## 2021-03-25 DIAGNOSIS — M5416 Radiculopathy, lumbar region: Secondary | ICD-10-CM | POA: Diagnosis not present

## 2021-03-27 DIAGNOSIS — H04223 Epiphora due to insufficient drainage, bilateral lacrimal glands: Secondary | ICD-10-CM | POA: Diagnosis not present

## 2021-03-27 DIAGNOSIS — D485 Neoplasm of uncertain behavior of skin: Secondary | ICD-10-CM | POA: Diagnosis not present

## 2021-03-27 DIAGNOSIS — Z85828 Personal history of other malignant neoplasm of skin: Secondary | ICD-10-CM | POA: Diagnosis not present

## 2021-03-27 DIAGNOSIS — L578 Other skin changes due to chronic exposure to nonionizing radiation: Secondary | ICD-10-CM | POA: Diagnosis not present

## 2021-03-27 DIAGNOSIS — H02413 Mechanical ptosis of bilateral eyelids: Secondary | ICD-10-CM | POA: Diagnosis not present

## 2021-03-27 DIAGNOSIS — H04553 Acquired stenosis of bilateral nasolacrimal duct: Secondary | ICD-10-CM | POA: Diagnosis not present

## 2021-03-27 DIAGNOSIS — D225 Melanocytic nevi of trunk: Secondary | ICD-10-CM | POA: Diagnosis not present

## 2021-03-27 DIAGNOSIS — H019 Unspecified inflammation of eyelid: Secondary | ICD-10-CM | POA: Diagnosis not present

## 2021-04-22 DIAGNOSIS — H57813 Brow ptosis, bilateral: Secondary | ICD-10-CM | POA: Diagnosis not present

## 2021-04-22 DIAGNOSIS — H02413 Mechanical ptosis of bilateral eyelids: Secondary | ICD-10-CM | POA: Diagnosis not present

## 2021-04-22 DIAGNOSIS — H02834 Dermatochalasis of left upper eyelid: Secondary | ICD-10-CM | POA: Diagnosis not present

## 2021-04-22 DIAGNOSIS — H02831 Dermatochalasis of right upper eyelid: Secondary | ICD-10-CM | POA: Diagnosis not present

## 2021-04-25 DIAGNOSIS — Z8546 Personal history of malignant neoplasm of prostate: Secondary | ICD-10-CM | POA: Diagnosis not present

## 2021-05-02 DIAGNOSIS — N5201 Erectile dysfunction due to arterial insufficiency: Secondary | ICD-10-CM | POA: Diagnosis not present

## 2021-05-02 DIAGNOSIS — Z8546 Personal history of malignant neoplasm of prostate: Secondary | ICD-10-CM | POA: Diagnosis not present

## 2021-05-28 DIAGNOSIS — M4316 Spondylolisthesis, lumbar region: Secondary | ICD-10-CM | POA: Diagnosis not present

## 2021-05-28 DIAGNOSIS — M4120 Other idiopathic scoliosis, site unspecified: Secondary | ICD-10-CM | POA: Diagnosis not present

## 2021-05-28 DIAGNOSIS — M48061 Spinal stenosis, lumbar region without neurogenic claudication: Secondary | ICD-10-CM | POA: Diagnosis not present

## 2021-05-28 DIAGNOSIS — M5416 Radiculopathy, lumbar region: Secondary | ICD-10-CM | POA: Diagnosis not present

## 2021-06-11 DIAGNOSIS — M5416 Radiculopathy, lumbar region: Secondary | ICD-10-CM | POA: Diagnosis not present

## 2021-06-11 DIAGNOSIS — M545 Low back pain, unspecified: Secondary | ICD-10-CM | POA: Diagnosis not present

## 2021-07-01 DIAGNOSIS — H5203 Hypermetropia, bilateral: Secondary | ICD-10-CM | POA: Diagnosis not present

## 2021-07-02 DIAGNOSIS — D0461 Carcinoma in situ of skin of right upper limb, including shoulder: Secondary | ICD-10-CM | POA: Diagnosis not present

## 2021-07-16 DIAGNOSIS — M4316 Spondylolisthesis, lumbar region: Secondary | ICD-10-CM | POA: Diagnosis not present

## 2021-07-16 DIAGNOSIS — R030 Elevated blood-pressure reading, without diagnosis of hypertension: Secondary | ICD-10-CM | POA: Diagnosis not present

## 2021-07-16 DIAGNOSIS — M48061 Spinal stenosis, lumbar region without neurogenic claudication: Secondary | ICD-10-CM | POA: Diagnosis not present

## 2021-07-16 DIAGNOSIS — M5416 Radiculopathy, lumbar region: Secondary | ICD-10-CM | POA: Diagnosis not present

## 2021-07-17 ENCOUNTER — Other Ambulatory Visit: Payer: Self-pay | Admitting: Neurosurgery

## 2021-07-17 DIAGNOSIS — I10 Essential (primary) hypertension: Secondary | ICD-10-CM | POA: Diagnosis not present

## 2021-07-17 DIAGNOSIS — E7800 Pure hypercholesterolemia, unspecified: Secondary | ICD-10-CM | POA: Diagnosis not present

## 2021-07-17 DIAGNOSIS — R7303 Prediabetes: Secondary | ICD-10-CM | POA: Diagnosis not present

## 2021-07-17 DIAGNOSIS — M48061 Spinal stenosis, lumbar region without neurogenic claudication: Secondary | ICD-10-CM | POA: Diagnosis not present

## 2021-07-24 DIAGNOSIS — M4802 Spinal stenosis, cervical region: Secondary | ICD-10-CM | POA: Diagnosis not present

## 2021-09-15 NOTE — Pre-Procedure Instructions (Signed)
Surgical Instructions    Your procedure is scheduled on Tuesday 09/23/21.   Report to [facility] Main Entrance "A" at 10:00 A.M., then check in with the Admitting office.  Call this number if you have problems the morning of surgery:  [phone]   If you have any questions prior to your surgery date call [phone]: Open Monday-Friday 8am-4pm    Remember:  Do not eat after midnight the night before your surgery  You may drink clear liquids until 09:00 A.M. the morning of your surgery.   Clear liquids allowed are: Water, Non-Citrus Juices (without pulp), Carbonated Beverages, Clear Tea, Black Coffee ONLY (NO MILK, CREAM OR POWDERED CREAMER of any kind), and Gatorade    Take these medicines the morning of surgery with A SIP OF WATER   acetaminophen (TYLENOL)  amLODipine (NORVASC)   Take these medicines if needed:   fluticasone (FLONASE)   As of today, STOP taking any Aspirin (unless otherwise instructed by your surgeon) Aleve, Naproxen, Ibuprofen, Motrin, Advil, Goody's, BC's, all herbal medications, fish oil, and all vitamins.     After your COVID test   You are not required to quarantine however you are required to wear a well-fitting mask when you are out and around people not in your household.  If your mask becomes wet or soiled, replace with a new one.  Wash your hands often with soap and water for 20 seconds or clean your hands with an alcohol-based hand sanitizer that contains at least 60% alcohol.  Do not share personal items.  Notify your provider: if you are in close contact with someone who has COVID  or if you develop a fever of 100.4 or greater, sneezing, cough, sore throat, shortness of breath or body aches.             Do not wear jewelry or makeup Do not wear lotions, powders, perfumes/colognes, or deodorant. Do not shave 48 hours prior to surgery.  Men may shave face and neck. Do not bring valuables to the hospital. DO Not wear nail polish, gel  polish, artificial nails, or any other type of covering on natural nails including finger and toenails. If patients have artificial nails, gel coating, etc. that need to be removed by a nail salon, please have this removed prior to surgery or surgery may need to be canceled/delayed if the surgeon/ anesthesia feels like the patient is unable to be adequately monitored.              is not responsible for any belongings or valuables.  Do NOT Smoke (Tobacco/Vaping)  24 hours prior to your procedure  If you use a CPAP at night, you may bring your mask for your overnight stay.   Contacts, glasses, hearing aids, dentures or partials may not be worn into surgery, please bring cases for these belongings   For patients admitted to the hospital, discharge time will be determined by your treatment team.   Patients discharged the day of surgery will not be allowed to drive home, and someone needs to stay with them for 24 hours.  NO VISITORS WILL BE ALLOWED IN PRE-OP WHERE PATIENTS ARE PREPPED FOR SURGERY.  ONLY 1 SUPPORT PERSON MAY BE PRESENT IN THE WAITING ROOM WHILE YOU ARE IN SURGERY.  IF YOU ARE TO BE ADMITTED, ONCE YOU ARE IN YOUR ROOM YOU WILL BE ALLOWED TWO (2) VISITORS. 1 (ONE) VISITOR MAY STAY OVERNIGHT BUT MUST ARRIVE TO THE ROOM BY 8pm.  Minor children may  have two parents present. Special consideration for safety and communication needs will be reviewed on a case by case basis.  Special instructions:    Oral Hygiene is also important to reduce your risk of infection.  Remember - BRUSH YOUR TEETH THE MORNING OF SURGERY WITH YOUR REGULAR TOOTHPASTE   [place]- Preparing For Surgery  Before surgery, you can play an important role. Because skin is not sterile, your skin needs to be as free of germs as possible. You can reduce the number of germs on your skin by washing with CHG (chlorahexidine gluconate) Soap before surgery.  CHG is an antiseptic cleaner which kills germs and bonds  with the skin to continue killing germs even after washing.     Please do not use if you have an allergy to CHG or antibacterial soaps. If your skin becomes reddened/irritated stop using the CHG.  Do not shave (including legs and underarms) for at least 48 hours prior to first CHG shower. It is OK to shave your face.  Please follow these instructions carefully.     Shower the NIGHT BEFORE SURGERY and the MORNING OF SURGERY with CHG Soap.   If you chose to wash your hair, wash your hair first as usual with your normal shampoo. After you shampoo, rinse your hair and body thoroughly to remove the shampoo.  Then [employer] and genitals (private parts) with your normal soap and rinse thoroughly to remove soap.  After that Use CHG Soap as you would any other liquid soap. You can apply CHG directly to the skin and wash gently with a scrungie or a clean washcloth.   Apply the CHG Soap to your body ONLY FROM THE NECK DOWN.  Do not use on open wounds or open sores. Avoid contact with your eyes, ears, mouth and genitals (private parts). Wash Face and genitals (private parts)  with your normal soap.   Wash thoroughly, paying special attention to the area where your surgery will be performed.  Thoroughly rinse your body with warm water from the neck down.  DO NOT shower/wash with your normal soap after using and rinsing off the CHG Soap.  Pat yourself dry with a CLEAN TOWEL.  Wear CLEAN PAJAMAS to bed the night before surgery  Place CLEAN SHEETS on your bed the night before your surgery  DO NOT SLEEP WITH PETS.   Day of Surgery:  Take a shower with CHG soap. Wear Clean/Comfortable clothing the morning of surgery Do not apply any deodorants/lotions.   Remember to brush your teeth WITH YOUR REGULAR TOOTHPASTE.   Please read over the following fact sheets that you were given.

## 2021-09-16 ENCOUNTER — Other Ambulatory Visit: Payer: Self-pay

## 2021-09-16 ENCOUNTER — Encounter (HOSPITAL_COMMUNITY): Payer: Self-pay

## 2021-09-16 ENCOUNTER — Encounter (HOSPITAL_COMMUNITY)
Admission: RE | Admit: 2021-09-16 | Discharge: 2021-09-16 | Disposition: A | Discharge status: Home / Self Care | Payer: Medicare Other | Source: Ambulatory Visit | Attending: Neurosurgery | Admitting: Neurosurgery

## 2021-09-16 VITALS — BP 161/74 | HR 71 | Temp 98.0°F | Resp 17 | Ht 69.0 in | Wt 152.2 lb

## 2021-09-16 DIAGNOSIS — Z8546 Personal history of malignant neoplasm of prostate: Secondary | ICD-10-CM | POA: Diagnosis not present

## 2021-09-16 DIAGNOSIS — I10 Essential (primary) hypertension: Secondary | ICD-10-CM | POA: Insufficient documentation

## 2021-09-16 DIAGNOSIS — M48061 Spinal stenosis, lumbar region without neurogenic claudication: Secondary | ICD-10-CM | POA: Insufficient documentation

## 2021-09-16 DIAGNOSIS — I080 Rheumatic disorders of both mitral and aortic valves: Secondary | ICD-10-CM | POA: Insufficient documentation

## 2021-09-16 DIAGNOSIS — Z01818 Encounter for other preprocedural examination: Secondary | ICD-10-CM | POA: Insufficient documentation

## 2021-09-16 DIAGNOSIS — Z79899 Other long term (current) drug therapy: Secondary | ICD-10-CM | POA: Diagnosis not present

## 2021-09-16 DIAGNOSIS — M5127 Other intervertebral disc displacement, lumbosacral region: Secondary | ICD-10-CM | POA: Insufficient documentation

## 2021-09-16 DIAGNOSIS — Z7982 Long term (current) use of aspirin: Secondary | ICD-10-CM | POA: Diagnosis not present

## 2021-09-16 DIAGNOSIS — E785 Hyperlipidemia, unspecified: Secondary | ICD-10-CM | POA: Insufficient documentation

## 2021-09-16 DIAGNOSIS — K219 Gastro-esophageal reflux disease without esophagitis: Secondary | ICD-10-CM | POA: Insufficient documentation

## 2021-09-16 DIAGNOSIS — R9431 Abnormal electrocardiogram [ECG] [EKG]: Secondary | ICD-10-CM | POA: Insufficient documentation

## 2021-09-16 DIAGNOSIS — I2510 Atherosclerotic heart disease of native coronary artery without angina pectoris: Secondary | ICD-10-CM | POA: Diagnosis not present

## 2021-09-16 LAB — CBC
HCT: 42.9 % (ref 39.0–52.0)
Hemoglobin: 14.2 g/dL (ref 13.0–17.0)
MCH: 30.9 pg (ref 26.0–34.0)
MCHC: 33.1 g/dL (ref 30.0–36.0)
MCV: 93.3 fL (ref 80.0–100.0)
Platelets: 296 10*3/uL (ref 150–400)
RBC: 4.6 MIL/uL (ref 4.22–5.81)
RDW: 12.3 % (ref 11.5–15.5)
WBC: 5.4 10*3/uL (ref 4.0–10.5)
nRBC: 0 % (ref 0.0–0.2)

## 2021-09-16 LAB — BASIC METABOLIC PANEL
Anion gap: 9 (ref 5–15)
BUN: 11 mg/dL (ref 8–23)
CO2: 28 mmol/L (ref 22–32)
Calcium: 9.2 mg/dL (ref 8.9–10.3)
Chloride: 97 mmol/L — ABNORMAL LOW (ref 98–111)
Creatinine, Ser: 0.85 mg/dL (ref 0.61–1.24)
GFR, Estimated: >60 mL/min (ref >60)
Glucose, Bld: 154 mg/dL — ABNORMAL HIGH (ref 70–99)
Potassium: 3.7 mmol/L (ref 3.5–5.1)
Sodium: 134 mmol/L — ABNORMAL LOW (ref 135–145)

## 2021-09-16 LAB — SURGICAL PCR SCREEN
MRSA, PCR: NEGATIVE
Staphylococcus aureus: NEGATIVE

## 2021-09-16 LAB — TYPE AND SCREEN
ABO/RH(D): O NEG
Antibody Screen: NEGATIVE

## 2021-09-16 NOTE — Progress Notes (Addendum)
PCP - Dr. Kara Dies Cardiologist - Dr. Angelena Form  PPM/ICD - n/a Device Orders - n/a Rep Notified - n/a  Chest x-ray - n/a EKG - 09/16/21 Stress Test - 2011. [facility] ECHO - 08/09/19 Cardiac Cath - 1961. As a teenager  Sleep Study - denies CPAP - n/a  Fasting Blood Sugar - n/a Checks Blood Sugar _____ times a day- n/a  Blood Thinner Instructions: n/a Aspirin Instructions: As of today stop taking Aspirin unless otherwise instructed by your surgeon.   ERAS Protcol - Yes PRE-SURGERY Ensure or G2- No  COVID TEST- Scheduled for 09/19/21. Patient is aware of date,time, and location.    Anesthesia review: Yes. Cardiac History  Patient denies shortness of breath, fever, cough and chest pain at PAT appointment   All instructions explained to the patient, with a verbal understanding of the material. Patient agrees to go over the instructions while at home for a better understanding. Patient also instructed to self quarantine after being tested for COVID-19. The opportunity to ask questions was provided.

## 2021-09-17 ENCOUNTER — Encounter (HOSPITAL_COMMUNITY): Payer: Self-pay

## 2021-09-17 DIAGNOSIS — M4316 Spondylolisthesis, lumbar region: Secondary | ICD-10-CM | POA: Diagnosis not present

## 2021-09-17 DIAGNOSIS — M5416 Radiculopathy, lumbar region: Secondary | ICD-10-CM | POA: Diagnosis not present

## 2021-09-17 DIAGNOSIS — I10 Essential (primary) hypertension: Secondary | ICD-10-CM | POA: Diagnosis not present

## 2021-09-17 NOTE — Progress Notes (Signed)
Anesthesia Chart Review:  Case: [id number] Date/Time: 09/23/21 1145   Procedure: Left Lumbar 4-5 Lumbar 5 Sacral 1 Transforaminal lumbar interbody fusion (Left: Back)   Anesthesia type: General   Pre-op diagnosis: Degenerative lumbar spinal stenosis   Location: [facility] OR ROOM 53 / [place] [facility]   Surgeons: Consuella Lose, MD       DISCUSSION: Patient is a 75 year old male scheduled for the above procedure.  History includes never smoker, HTN, murmur/MVP (with mild MR 07/2019), HLD, GERD, pulmonary nodules (2016, felt related to infectious/inflammatory process), prostate cancer (s/p robot assisted laparoscopic radical prostatectomy 01/24/15), skin cancer.  He is followed by cardiologist Dr. Angelena Form for MVP which was diagnosed in the 1960's. Last visit 09/09/20. He was previously followed by Ezzard Standing, MD prior to Dr. Thurman Coyer retirement a few years ago. He is intolerant to statins. Last echo was in September 2020 and showed LVEF 60-65%, mild MVP with mild MR and mild AI. Next echo is planned for around 07/2022. Next office visit is scheduled for 12/10/21. He denied SOB, chest pain, cough, fever at PAT RN visit.   Preoperative COVID-19 test scheduled for 09/19/21. Anesthesia team to evaluate on the day of surgery.    VS: BP (!) 161/74   Pulse 71   Temp 36.7 C (Oral)   Resp 17   Ht 5\' 9"  (1.753 m)   Wt 69 kg   SpO2 100%   BMI 22.48 kg/m    PROVIDERS: Antony Contras, MD is PCP  Lauree Chandler, MD is cardiologist  Raynelle Bring, MD is urologist Christinia Gully, MD is pulmonologist. Last visit 11/13/15 for pulmonary nodules folllow-up. Scattered clusters of micro nodules in bilateral uppers labs were unchanged and felt compatible with remote infectious or inflammatory process. As needed follow-up recommended.   LABS: Labs reviewed: Acceptable for surgery. (all labs ordered are listed, but only abnormal results are displayed)  Labs Reviewed  BASIC METABOLIC PANEL - Abnormal; Notable  for the following components:      Result Value   Sodium 134 (*)    Chloride 97 (*)    Glucose, Bld 154 (*)    All other components within normal limits  SURGICAL PCR SCREEN  CBC  TYPE AND SCREEN     IMAGES: MRI L-spine 06/11/21 ([facility]/PACS): IMPRESSION: 1. At L4-L5, similar versus slightly progressed grade 1 anterolisthesis. Similar mild central canal and mild-to-moderate bilateral subarticular recess stenosis at this level with disc contacting bilateral descending L5 nerve roots. 2. At L5-S1, mild progression of mild to moderate left foraminal stenosis with left far lateral/extraforaminal disc protrusion closely approximating the exiting/exited left L5 nerve.    EKG:09/16/21: Normal sinus rhythm Minimal voltage criteria for LVH, may be normal variant (Sokolow-Lyon) Nonspecific ST abnormality Abnormal ECG No previous tracing Confirmed by Oswaldo Milian [phone]) on 09/16/2021 10:33:32 PM   CV: Echo 08/09/19: IMPRESSIONS   1. The left ventricle has normal systolic function with an ejection  fraction of 60-65%. The cavity size was normal. Left ventricular diastolic  Doppler parameters are indeterminate.   2. The right ventricle has normal systolic function. The cavity was  mildly enlarged. There is No increase in right ventricular wall thickness.   3. Left atrial size was normal.   4. Right atrial size was normal.   5. Mild mitral valve prolapse.   6. The mitral valve is myxomatous. mild thickening of the mitral valve  leaflet. No evidence of mitral valve stenosis.   7. The aortic valve is tricuspid. Mild thickening  of the aortic valve.  Mild calcification of the aortic valve. Aortic valve regurgitation is mild  by color flow Doppler.   8. Pulmonic valve regurgitation is mild by color flow Doppler.   9. The aorta is normal unless otherwise noted.    He reported a stress test ~ 2011 at the former [facility] and a cardiac cath in 1961 when he  was diagnosed with MVP.   Past Medical History:  Diagnosis Date   ALLERGIC RHINITIS 08/09/2008   Qualifier: Diagnosis of  By: Doy Mince LPN, Megan     Arthritis    joints, generalized   Cancer ([place])    skin cancer excision right arm 2'16   Duodenal ulcer    80's   G E R D 08/09/2008   Qualifier: Diagnosis of  By: Doy Mince LPN, Megan     Heart murmur    Hemorrhoids    Hx of gastric ulcer    HYPERLIPIDEMIA 08/09/2008   Qualifier: Diagnosis of  By: Doy Mince LPN, Megan     Hypertension    HYPERTENSION 08/09/2008   Qualifier: Diagnosis of  By: Doy Mince LPN, Megan     LOW HDL 08/09/2008   Qualifier: Diagnosis of  By: Doy Mince LPN, Megan     Mitral valve prolapse    MITRAL VALVE PROLAPSE 08/09/2008   Qualifier: Diagnosis of  By: Doy Mince LPN, Megan     Multiple pulmonary nodules 02/12/2015   Followed in Pulmonary clinic/ [facility]/ [place]/ never smoker - See CT chest Jan 18 2015 [facility] Multiple tree in bud nodules plus one macrolobulated nodule 1.4 cm LUL  - f/u ov 11/12/2015 no change/ no symtoms > f/u prn     Prostate cancer ([place]) 01/24/2015    Past Surgical History:  Procedure Laterality Date   CARDIAC CATHETERIZATION     '61-[facility] age 48, evaluate heart murmur-dx."MVP"   HERNIA REPAIR     laparoscopic   LYMPHADENECTOMY Bilateral 01/24/2015   Procedure: LYMPHADENECTOMY;  Surgeon: Raynelle Bring, MD;  Location: [facility] ORS;  Service: Urology;  Laterality: Bilateral;   PROSTATE BIOPSY  01/24/2015   ROBOT ASSISTED LAPAROSCOPIC RADICAL PROSTATECTOMY N/A 01/24/2015   Procedure: ROBOTIC ASSISTED LAPAROSCOPIC RADICAL PROSTATECTOMY LEVEL 3;  Surgeon: Raynelle Bring, MD;  Location: [facility]S;  Service: Urology;  Laterality: N/A;   SALIVARY STONE REMOVAL     TEAR DUCT PROBING Left 2022    MEDICATIONS:  acetaminophen (TYLENOL) 500 MG tablet   amLODipine (NORVASC) 2.5 MG tablet   aspirin EC 81 MG tablet   Calcium 600-200 MG-UNIT tablet   chlorpheniramine (CHLOR-TRIMETON) 4  MG tablet   Cholecalciferol (VITAMIN D) 2000 UNITS CAPS   CINNAMON PO   fluticasone (FLONASE) 50 MCG/ACT nasal spray   Glucosamine HCl-MSM (GLUCOSAMINE-MSM PO)   hydrocortisone cream 1 %   Magnesium 200 MG TABS   olmesartan (BENICAR) 40 MG tablet   Omega-3 Fatty Acids (FISH OIL) 1000 MG CAPS   Probiotic Product (PROBIOTIC PO)   Psyllium (METAMUCIL PO)   sildenafil (REVATIO) 20 MG tablet   TURMERIC PO   vitamin B-12 (CYANOCOBALAMIN) 500 MCG tablet   vitamin C (ASCORBIC ACID) 500 MG tablet   Zinc 30 MG TABS   No current facility-administered medications for this encounter.    Myra Gianotti, PA-C Surgical Short Stay/Anesthesiology [facility] Phone [phone] [place] Phone [phone] 09/17/2021 9:32 AM

## 2021-09-17 NOTE — Anesthesia Preprocedure Evaluation (Addendum)
Anesthesia Evaluation  Patient identified by MRN, date of birth, ID band Patient awake    Reviewed: Allergy & Precautions, NPO status , Patient's Chart, lab work & pertinent test results  Airway Mallampati: I  TM Distance: >3 FB Neck ROM: Full    Dental  (+) Teeth Intact, Dental Advisory Given, Caps   Pulmonary neg pulmonary ROS,    Pulmonary exam normal breath sounds clear to auscultation       Cardiovascular hypertension, Pt. on medications (-) angina(-) Past MI Normal cardiovascular exam+ Valvular Problems/Murmurs MVP  Rhythm:Regular Rate:Normal     Neuro/Psych Degenerative lumbar spinal stenosis negative psych ROS   GI/Hepatic Neg liver ROS, PUD, GERD  ,  Endo/Other  negative endocrine ROS  Renal/GU negative Renal ROS   Prostate cancer     Musculoskeletal  (+) Arthritis ,   Abdominal   Peds  Hematology negative hematology ROS (+)   Anesthesia Other Findings Day of surgery medications reviewed with the patient.  Reproductive/Obstetrics                           Anesthesia Physical Anesthesia Plan  ASA: 2  Anesthesia Plan: General   Post-op Pain Management:    Induction: Intravenous  PONV Risk Score and Plan: 2 and Dexamethasone and Ondansetron  Airway Management Planned: Oral ETT  Additional Equipment:   Intra-op Plan:   Post-operative Plan: Extubation in OR  Informed Consent: I have reviewed the patients History and Physical, chart, labs and discussed the procedure including the risks, benefits and alternatives for the proposed anesthesia with the patient or authorized representative who has indicated his/her understanding and acceptance.     Dental advisory given  Plan Discussed with: CRNA  Anesthesia Plan Comments: (PAT note written 09/17/2021 by Myra Gianotti, PA-C.  2nd PIV, CLEAR SIGHT.)      Anesthesia Quick Evaluation

## 2021-09-19 ENCOUNTER — Other Ambulatory Visit (HOSPITAL_COMMUNITY)
Admission: RE | Admit: 2021-09-19 | Discharge: 2021-09-19 | Disposition: A | Discharge status: Home / Self Care | Payer: Medicare Other | Source: Ambulatory Visit | Attending: Neurosurgery | Admitting: Neurosurgery

## 2021-09-19 DIAGNOSIS — Z01818 Encounter for other preprocedural examination: Secondary | ICD-10-CM

## 2021-09-19 DIAGNOSIS — Z01812 Encounter for preprocedural laboratory examination: Secondary | ICD-10-CM | POA: Insufficient documentation

## 2021-09-19 DIAGNOSIS — Z20822 Contact with and (suspected) exposure to covid-19: Secondary | ICD-10-CM | POA: Insufficient documentation

## 2021-09-19 LAB — SARS CORONAVIRUS 2 (TAT 6-24 HRS): SARS Coronavirus 2: NEGATIVE

## 2021-09-23 ENCOUNTER — Inpatient Hospital Stay (HOSPITAL_COMMUNITY): Payer: Medicare Other | Admitting: Vascular Surgery

## 2021-09-23 ENCOUNTER — Encounter (HOSPITAL_COMMUNITY)
Admission: RE | Disposition: A | Discharge status: Home / Self Care | Payer: Self-pay | Source: Home / Self Care | Attending: Neurosurgery

## 2021-09-23 ENCOUNTER — Inpatient Hospital Stay (HOSPITAL_COMMUNITY)
Admission: RE | Admit: 2021-09-23 | Discharge: 2021-09-24 | DRG: 455 | Disposition: A | Discharge status: Home / Self Care | Payer: Medicare Other | Attending: Neurosurgery | Admitting: Neurosurgery

## 2021-09-23 ENCOUNTER — Encounter (HOSPITAL_COMMUNITY): Payer: Self-pay | Admitting: Neurosurgery

## 2021-09-23 ENCOUNTER — Inpatient Hospital Stay (HOSPITAL_COMMUNITY): Payer: Medicare Other

## 2021-09-23 ENCOUNTER — Other Ambulatory Visit: Payer: Self-pay

## 2021-09-23 ENCOUNTER — Inpatient Hospital Stay (HOSPITAL_COMMUNITY): Payer: Medicare Other | Admitting: Anesthesiology

## 2021-09-23 DIAGNOSIS — Z79899 Other long term (current) drug therapy: Secondary | ICD-10-CM | POA: Diagnosis not present

## 2021-09-23 DIAGNOSIS — Z981 Arthrodesis status: Secondary | ICD-10-CM | POA: Diagnosis not present

## 2021-09-23 DIAGNOSIS — Z85828 Personal history of other malignant neoplasm of skin: Secondary | ICD-10-CM | POA: Diagnosis not present

## 2021-09-23 DIAGNOSIS — E785 Hyperlipidemia, unspecified: Secondary | ICD-10-CM | POA: Diagnosis present

## 2021-09-23 DIAGNOSIS — Z8711 Personal history of peptic ulcer disease: Secondary | ICD-10-CM | POA: Diagnosis not present

## 2021-09-23 DIAGNOSIS — Z888 Allergy status to other drugs, medicaments and biological substances status: Secondary | ICD-10-CM | POA: Diagnosis not present

## 2021-09-23 DIAGNOSIS — M4807 Spinal stenosis, lumbosacral region: Secondary | ICD-10-CM | POA: Diagnosis present

## 2021-09-23 DIAGNOSIS — Z419 Encounter for procedure for purposes other than remedying health state, unspecified: Secondary | ICD-10-CM

## 2021-09-23 DIAGNOSIS — M4326 Fusion of spine, lumbar region: Secondary | ICD-10-CM | POA: Diagnosis not present

## 2021-09-23 DIAGNOSIS — K219 Gastro-esophageal reflux disease without esophagitis: Secondary | ICD-10-CM | POA: Diagnosis not present

## 2021-09-23 DIAGNOSIS — I10 Essential (primary) hypertension: Secondary | ICD-10-CM | POA: Diagnosis present

## 2021-09-23 DIAGNOSIS — M4316 Spondylolisthesis, lumbar region: Secondary | ICD-10-CM | POA: Diagnosis not present

## 2021-09-23 DIAGNOSIS — Z7982 Long term (current) use of aspirin: Secondary | ICD-10-CM

## 2021-09-23 DIAGNOSIS — M48061 Spinal stenosis, lumbar region without neurogenic claudication: Secondary | ICD-10-CM | POA: Diagnosis not present

## 2021-09-23 SURGERY — POSTERIOR LUMBAR FUSION 1 LEVEL
Anesthesia: General | Laterality: Left

## 2021-09-23 MED ORDER — THROMBIN 5000 UNITS EX SOLR
OROMUCOSAL | Status: DC | PRN
  Administered 2021-09-23: 5 mL via TOPICAL

## 2021-09-23 MED ORDER — RISAQUAD PO CAPS
1.0000 | ORAL_CAPSULE | Freq: Every day | ORAL | Status: DC
  Administered 2021-09-23 – 2021-09-24 (×2): 1 via ORAL
  Filled 2021-09-23 (×2): qty 1

## 2021-09-23 MED ORDER — ONDANSETRON HCL 4 MG/2ML IJ SOLN
INTRAMUSCULAR | Status: DC | PRN
  Administered 2021-09-23: 4 mg via INTRAVENOUS

## 2021-09-23 MED ORDER — SUGAMMADEX SODIUM 200 MG/2ML IV SOLN
INTRAVENOUS | Status: DC | PRN
  Administered 2021-09-23: 200 mg via INTRAVENOUS

## 2021-09-23 MED ORDER — PHENYLEPHRINE 40 MCG/ML (10ML) SYRINGE FOR IV PUSH (FOR BLOOD PRESSURE SUPPORT)
PREFILLED_SYRINGE | INTRAVENOUS | Status: DC | PRN
  Administered 2021-09-23: 120 ug via INTRAVENOUS
  Administered 2021-09-23 (×2): 80 ug via INTRAVENOUS
  Administered 2021-09-23: 40 ug via INTRAVENOUS

## 2021-09-23 MED ORDER — OXYCODONE HCL 5 MG PO TABS
10.0000 mg | ORAL_TABLET | ORAL | Status: DC | PRN
  Administered 2021-09-24: 10 mg via ORAL
  Filled 2021-09-23 (×2): qty 2

## 2021-09-23 MED ORDER — ORAL CARE MOUTH RINSE
15.0000 mL | Freq: Once | OROMUCOSAL | Status: AC
Start: 2021-09-23 — End: 2021-09-23

## 2021-09-23 MED ORDER — CHLORHEXIDINE GLUCONATE 0.12 % MT SOLN: 15.0000 mL | Freq: Once | OROMUCOSAL | Status: AC

## 2021-09-23 MED ORDER — DIPHENHYDRAMINE HCL 25 MG PO TABS
25.0000 mg | ORAL_TABLET | Freq: Four times a day (QID) | ORAL | Status: DC | PRN
  Filled 2021-09-23: qty 1

## 2021-09-23 MED ORDER — METHOCARBAMOL 500 MG PO TABS
500.0000 mg | ORAL_TABLET | Freq: Four times a day (QID) | ORAL | Status: DC | PRN
  Administered 2021-09-23 – 2021-09-24 (×2): 500 mg via ORAL
  Filled 2021-09-23 (×2): qty 1

## 2021-09-23 MED ORDER — ONDANSETRON HCL 4 MG/2ML IJ SOLN: 4.0000 mg | Freq: Four times a day (QID) | INTRAMUSCULAR | Status: DC | PRN

## 2021-09-23 MED ORDER — ACETAMINOPHEN 325 MG PO TABS
650.0000 mg | ORAL_TABLET | ORAL | Status: DC | PRN
  Filled 2021-09-23: qty 2

## 2021-09-23 MED ORDER — CEFAZOLIN SODIUM-DEXTROSE 2-4 GM/100ML-% IV SOLN
INTRAVENOUS | Status: AC
  Filled 2021-09-23: qty 100

## 2021-09-23 MED ORDER — PHENYLEPHRINE HCL-NACL 20-0.9 MG/250ML-% IV SOLN
INTRAVENOUS | Status: DC | PRN
  Administered 2021-09-23: 75 ug/min via INTRAVENOUS

## 2021-09-23 MED ORDER — SODIUM CHLORIDE 0.9% FLUSH: 3.0000 mL | INTRAVENOUS | Status: DC | PRN

## 2021-09-23 MED ORDER — MORPHINE SULFATE (PF) 2 MG/ML IV SOLN: 2.0000 mg | INTRAVENOUS | Status: DC | PRN

## 2021-09-23 MED ORDER — ACETAMINOPHEN 10 MG/ML IV SOLN
INTRAVENOUS | Status: AC
  Filled 2021-09-23: qty 100

## 2021-09-23 MED ORDER — PHENOL 1.4 % MT LIQD: 1.0000 | OROMUCOSAL | Status: DC | PRN

## 2021-09-23 MED ORDER — CHLORHEXIDINE GLUCONATE CLOTH 2 % EX PADS: 6.0000 | MEDICATED_PAD | Freq: Once | CUTANEOUS | Status: DC

## 2021-09-23 MED ORDER — 0.9 % SODIUM CHLORIDE (POUR BTL) OPTIME
TOPICAL | Status: DC | PRN
  Administered 2021-09-23: 1000 mL

## 2021-09-23 MED ORDER — CALCIUM 600-200 MG-UNIT PO TABS
1.0000 | ORAL_TABLET | Freq: Every day | ORAL | Status: DC
Start: 2021-09-23 — End: 2021-09-23

## 2021-09-23 MED ORDER — ONDANSETRON HCL 4 MG/2ML IJ SOLN: 4.0000 mg | Freq: Once | INTRAMUSCULAR | Status: DC | PRN

## 2021-09-23 MED ORDER — THROMBIN 5000 UNITS EX SOLR
CUTANEOUS | Status: AC
  Filled 2021-09-23: qty 5000

## 2021-09-23 MED ORDER — MAGNESIUM 200 MG PO TABS: 200.0000 mg | ORAL_TABLET | Freq: Two times a day (BID) | ORAL | Status: DC

## 2021-09-23 MED ORDER — ROCURONIUM BROMIDE 10 MG/ML (PF) SYRINGE
PREFILLED_SYRINGE | INTRAVENOUS | Status: DC | PRN
Start: 2021-09-23 — End: 2021-09-23
  Administered 2021-09-23 (×3): 10 mg via INTRAVENOUS
  Administered 2021-09-23: 60 mg via INTRAVENOUS
  Administered 2021-09-23: 10 mg via INTRAVENOUS

## 2021-09-23 MED ORDER — DEXAMETHASONE SODIUM PHOSPHATE 10 MG/ML IJ SOLN
INTRAMUSCULAR | Status: AC
  Filled 2021-09-23: qty 1

## 2021-09-23 MED ORDER — FENTANYL CITRATE (PF) 250 MCG/5ML IJ SOLN
INTRAMUSCULAR | Status: DC | PRN
  Administered 2021-09-23 (×4): 50 ug via INTRAVENOUS

## 2021-09-23 MED ORDER — CEFAZOLIN SODIUM-DEXTROSE 2-4 GM/100ML-% IV SOLN
2.0000 g | Freq: Three times a day (TID) | INTRAVENOUS | Status: AC
  Administered 2021-09-23 – 2021-09-24 (×2): 2 g via INTRAVENOUS
  Filled 2021-09-23 (×2): qty 100

## 2021-09-23 MED ORDER — GLYCOPYRROLATE PF 0.2 MG/ML IJ SOSY
PREFILLED_SYRINGE | INTRAMUSCULAR | Status: AC
  Filled 2021-09-23: qty 1

## 2021-09-23 MED ORDER — ONDANSETRON HCL 4 MG PO TABS: 4.0000 mg | ORAL_TABLET | Freq: Four times a day (QID) | ORAL | Status: DC | PRN

## 2021-09-23 MED ORDER — CYANOCOBALAMIN 500 MCG PO TABS
500.0000 ug | ORAL_TABLET | Freq: Every day | ORAL | Status: DC
  Administered 2021-09-23 – 2021-09-24 (×2): 500 ug via ORAL
  Filled 2021-09-23 (×2): qty 1

## 2021-09-23 MED ORDER — PANTOPRAZOLE SODIUM 40 MG PO TBEC
40.0000 mg | DELAYED_RELEASE_TABLET | Freq: Every day | ORAL | Status: DC
  Administered 2021-09-23 – 2021-09-24 (×2): 40 mg via ORAL
  Filled 2021-09-23 (×2): qty 1

## 2021-09-23 MED ORDER — PANTOPRAZOLE SODIUM 40 MG IV SOLR: 40.0000 mg | Freq: Every day | INTRAVENOUS | Status: DC

## 2021-09-23 MED ORDER — MAGNESIUM OXIDE -MG SUPPLEMENT 400 (240 MG) MG PO TABS
200.0000 mg | ORAL_TABLET | Freq: Two times a day (BID) | ORAL | Status: DC
  Administered 2021-09-23 – 2021-09-24 (×2): 200 mg via ORAL
  Filled 2021-09-23 (×2): qty 1

## 2021-09-23 MED ORDER — GLYCOPYRROLATE PF 0.2 MG/ML IJ SOSY
PREFILLED_SYRINGE | INTRAMUSCULAR | Status: DC | PRN
  Administered 2021-09-23: .2 mg via INTRAVENOUS

## 2021-09-23 MED ORDER — ROCURONIUM BROMIDE 10 MG/ML (PF) SYRINGE
PREFILLED_SYRINGE | INTRAVENOUS | Status: AC
  Filled 2021-09-23: qty 10

## 2021-09-23 MED ORDER — LIDOCAINE-EPINEPHRINE 1 %-1:100000 IJ SOLN
INTRAMUSCULAR | Status: DC | PRN
  Administered 2021-09-23: 5 mL

## 2021-09-23 MED ORDER — SODIUM CHLORIDE 0.9 % IV SOLN: INTRAVENOUS | Status: DC

## 2021-09-23 MED ORDER — SODIUM CHLORIDE 0.9 % IV SOLN: 250.0000 mL | INTRAVENOUS | Status: DC

## 2021-09-23 MED ORDER — OYSTER SHELL CALCIUM/D3 500-5 MG-MCG PO TABS
1.0000 | ORAL_TABLET | Freq: Every day | ORAL | Status: DC
  Administered 2021-09-23 – 2021-09-24 (×2): 1 via ORAL
  Filled 2021-09-23 (×2): qty 1

## 2021-09-23 MED ORDER — ONDANSETRON HCL 4 MG/2ML IJ SOLN
INTRAMUSCULAR | Status: AC
  Filled 2021-09-23: qty 2

## 2021-09-23 MED ORDER — LIDOCAINE 2% (20 MG/ML) 5 ML SYRINGE
INTRAMUSCULAR | Status: DC | PRN
  Administered 2021-09-23: 80 mg via INTRAVENOUS

## 2021-09-23 MED ORDER — ACETAMINOPHEN 500 MG PO TABS
ORAL_TABLET | ORAL | Status: AC
  Filled 2021-09-23: qty 2

## 2021-09-23 MED ORDER — PHENYLEPHRINE 40 MCG/ML (10ML) SYRINGE FOR IV PUSH (FOR BLOOD PRESSURE SUPPORT)
PREFILLED_SYRINGE | INTRAVENOUS | Status: AC
  Filled 2021-09-23: qty 10

## 2021-09-23 MED ORDER — FENTANYL CITRATE (PF) 250 MCG/5ML IJ SOLN
INTRAMUSCULAR | Status: AC
  Filled 2021-09-23: qty 5

## 2021-09-23 MED ORDER — SODIUM CHLORIDE 0.9% FLUSH: 3.0000 mL | Freq: Two times a day (BID) | INTRAVENOUS | Status: DC

## 2021-09-23 MED ORDER — BUPIVACAINE HCL (PF) 0.5 % IJ SOLN
INTRAMUSCULAR | Status: DC | PRN
  Administered 2021-09-23: 5 mL

## 2021-09-23 MED ORDER — ACETAMINOPHEN 10 MG/ML IV SOLN
INTRAVENOUS | Status: DC | PRN
  Administered 2021-09-23: 1000 mg via INTRAVENOUS

## 2021-09-23 MED ORDER — IRBESARTAN 150 MG PO TABS
300.0000 mg | ORAL_TABLET | Freq: Every day | ORAL | Status: DC
  Administered 2021-09-23 – 2021-09-24 (×2): 300 mg via ORAL
  Filled 2021-09-23 (×2): qty 2

## 2021-09-23 MED ORDER — BUPIVACAINE HCL (PF) 0.5 % IJ SOLN
INTRAMUSCULAR | Status: AC
  Filled 2021-09-23: qty 30

## 2021-09-23 MED ORDER — OXYCODONE HCL 5 MG PO TABS
5.0000 mg | ORAL_TABLET | ORAL | Status: DC | PRN
  Administered 2021-09-23 – 2021-09-24 (×2): 5 mg via ORAL

## 2021-09-23 MED ORDER — CEFAZOLIN SODIUM-DEXTROSE 2-4 GM/100ML-% IV SOLN
2.0000 g | INTRAVENOUS | Status: AC
  Administered 2021-09-23: 2 g via INTRAVENOUS

## 2021-09-23 MED ORDER — SENNA 8.6 MG PO TABS
1.0000 | ORAL_TABLET | Freq: Two times a day (BID) | ORAL | Status: DC
  Administered 2021-09-23 – 2021-09-24 (×2): 8.6 mg via ORAL
  Filled 2021-09-23 (×2): qty 1

## 2021-09-23 MED ORDER — METHOCARBAMOL 1000 MG/10ML IJ SOLN
500.0000 mg | Freq: Four times a day (QID) | INTRAVENOUS | Status: DC | PRN
  Filled 2021-09-23: qty 5

## 2021-09-23 MED ORDER — AMLODIPINE BESYLATE 5 MG PO TABS: 2.5000 mg | ORAL_TABLET | Freq: Every day | ORAL | Status: DC

## 2021-09-23 MED ORDER — MENTHOL 3 MG MT LOZG: 1.0000 | LOZENGE | OROMUCOSAL | Status: DC | PRN

## 2021-09-23 MED ORDER — ZINC 30 MG PO TABS: 30.0000 mg | ORAL_TABLET | ORAL | Status: DC

## 2021-09-23 MED ORDER — DEXAMETHASONE SODIUM PHOSPHATE 10 MG/ML IJ SOLN
INTRAMUSCULAR | Status: DC | PRN
  Administered 2021-09-23: 4 mg via INTRAVENOUS

## 2021-09-23 MED ORDER — CHLORHEXIDINE GLUCONATE 0.12 % MT SOLN
OROMUCOSAL | Status: AC
  Administered 2021-09-23: 15 mL via OROMUCOSAL
  Filled 2021-09-23: qty 15

## 2021-09-23 MED ORDER — DOCUSATE SODIUM 100 MG PO CAPS
100.0000 mg | ORAL_CAPSULE | Freq: Two times a day (BID) | ORAL | Status: DC
  Administered 2021-09-23 – 2021-09-24 (×2): 100 mg via ORAL
  Filled 2021-09-23 (×2): qty 1

## 2021-09-23 MED ORDER — FENTANYL CITRATE (PF) 100 MCG/2ML IJ SOLN: 25.0000 ug | INTRAMUSCULAR | Status: DC | PRN

## 2021-09-23 MED ORDER — BISACODYL 10 MG RE SUPP: 10.0000 mg | Freq: Every day | RECTAL | Status: DC | PRN

## 2021-09-23 MED ORDER — PROPOFOL 10 MG/ML IV BOLUS
INTRAVENOUS | Status: DC | PRN
  Administered 2021-09-23: 130 mg via INTRAVENOUS

## 2021-09-23 MED ORDER — LIDOCAINE 2% (20 MG/ML) 5 ML SYRINGE
INTRAMUSCULAR | Status: AC
  Filled 2021-09-23: qty 5

## 2021-09-23 MED ORDER — LIDOCAINE-EPINEPHRINE 1 %-1:100000 IJ SOLN
INTRAMUSCULAR | Status: AC
  Filled 2021-09-23: qty 1

## 2021-09-23 MED ORDER — LACTATED RINGERS IV SOLN: INTRAVENOUS | Status: DC

## 2021-09-23 MED ORDER — ACETAMINOPHEN 500 MG PO TABS: 1000.0000 mg | ORAL_TABLET | Freq: Once | ORAL | Status: DC

## 2021-09-23 MED ORDER — ASCORBIC ACID 500 MG PO TABS
500.0000 mg | ORAL_TABLET | Freq: Every day | ORAL | Status: DC
  Administered 2021-09-23 – 2021-09-24 (×2): 500 mg via ORAL
  Filled 2021-09-23 (×2): qty 1

## 2021-09-23 MED ORDER — VITAMIN D 25 MCG (1000 UNIT) PO TABS
2000.0000 [IU] | ORAL_TABLET | Freq: Every day | ORAL | Status: DC
  Administered 2021-09-23 – 2021-09-24 (×2): 2000 [IU] via ORAL
  Filled 2021-09-23 (×2): qty 2

## 2021-09-23 MED ORDER — ACETAMINOPHEN 650 MG RE SUPP: 650.0000 mg | RECTAL | Status: DC | PRN

## 2021-09-23 MED ORDER — PROBIOTIC PO TBEC: DELAYED_RELEASE_TABLET | Freq: Every day | ORAL | Status: DC

## 2021-09-23 SURGICAL SUPPLY — 67 items
BAG COUNTER SPONGE SURGICOUNT (BAG) ×4 IMPLANT
BAG SURGICOUNT SPONGE COUNTING (BAG) ×2
BASKET BONE COLLECTION (BASKET) ×3 IMPLANT
BENZOIN TINCTURE PRP APPL 2/3 (GAUZE/BANDAGES/DRESSINGS) IMPLANT
BLADE CLIPPER SURG (BLADE) IMPLANT
BLADE SURG 11 STRL SS (BLADE) ×3 IMPLANT
BUR MATCHSTICK NEURO 3.0 LAGG (BURR) ×3 IMPLANT
BUR PRECISION FLUTE 5.0 (BURR) ×3 IMPLANT
CAGE INTERBODY PL LG 7X26.5X24 (Cage) ×6 IMPLANT
CANISTER SUCT 3000ML PPV (MISCELLANEOUS) ×3 IMPLANT
CARTRIDGE OIL MAESTRO DRILL (MISCELLANEOUS) ×1 IMPLANT
CLOSURE WOUND 1/2 X4 (GAUZE/BANDAGES/DRESSINGS)
CNTNR URN SCR LID CUP LEK RST (MISCELLANEOUS) ×1 IMPLANT
CONT SPEC 4OZ STRL OR WHT (MISCELLANEOUS) ×3
COVER BACK TABLE 60X90IN (DRAPES) ×3 IMPLANT
DECANTER SPIKE VIAL GLASS SM (MISCELLANEOUS) ×3 IMPLANT
DERMABOND ADVANCED (GAUZE/BANDAGES/DRESSINGS) ×2
DERMABOND ADVANCED .7 DNX12 (GAUZE/BANDAGES/DRESSINGS) ×1 IMPLANT
DIFFUSER DRILL AIR PNEUMATIC (MISCELLANEOUS) ×3 IMPLANT
DRAPE C-ARM 42X72 X-RAY (DRAPES) ×3 IMPLANT
DRAPE C-ARMOR (DRAPES) ×3 IMPLANT
DRAPE LAPAROTOMY 100X72X124 (DRAPES) ×3 IMPLANT
DRAPE SURG 17X23 STRL (DRAPES) ×3 IMPLANT
DRSG OPSITE POSTOP 4X6 (GAUZE/BANDAGES/DRESSINGS) ×3 IMPLANT
DURAPREP 26ML APPLICATOR (WOUND CARE) ×3 IMPLANT
ELECT REM PT RETURN 9FT ADLT (ELECTROSURGICAL) ×3
ELECTRODE REM PT RTRN 9FT ADLT (ELECTROSURGICAL) ×1 IMPLANT
GAUZE 4X4 16PLY ~~LOC~~+RFID DBL (SPONGE) ×6 IMPLANT
GAUZE SPONGE 4X4 12PLY STRL (GAUZE/BANDAGES/DRESSINGS) IMPLANT
GLOVE EXAM NITRILE XL STR (GLOVE) IMPLANT
GLOVE SURG ENC MOIS LTX SZ7.5 (GLOVE) IMPLANT
GLOVE SURG LTX SZ7 (GLOVE) ×6 IMPLANT
GLOVE SURG UNDER POLY LF SZ7.5 (GLOVE) ×6 IMPLANT
GOWN STRL REUS W/ TWL LRG LVL3 (GOWN DISPOSABLE) ×4 IMPLANT
GOWN STRL REUS W/ TWL XL LVL3 (GOWN DISPOSABLE) IMPLANT
GOWN STRL REUS W/TWL 2XL LVL3 (GOWN DISPOSABLE) IMPLANT
GOWN STRL REUS W/TWL LRG LVL3 (GOWN DISPOSABLE) ×12
GOWN STRL REUS W/TWL XL LVL3 (GOWN DISPOSABLE)
GRAFT BONE PROTEIOS XS 0.5CC (Orthopedic Implant) ×3 IMPLANT
HEMOSTAT POWDER KIT SURGIFOAM (HEMOSTASIS) ×3 IMPLANT
KIT BASIN OR (CUSTOM PROCEDURE TRAY) ×3 IMPLANT
KIT POSITION SURG JACKSON T1 (MISCELLANEOUS) ×3 IMPLANT
KIT TURNOVER KIT B (KITS) ×3 IMPLANT
MILL MEDIUM DISP (BLADE) ×3 IMPLANT
NEEDLE HYPO 18GX1.5 BLUNT FILL (NEEDLE) IMPLANT
NEEDLE HYPO 22GX1.5 SAFETY (NEEDLE) ×3 IMPLANT
NEEDLE SPNL 18GX3.5 QUINCKE PK (NEEDLE) IMPLANT
NS IRRIG 1000ML POUR BTL (IV SOLUTION) ×3 IMPLANT
OIL CARTRIDGE MAESTRO DRILL (MISCELLANEOUS) ×3
PACK LAMINECTOMY NEURO (CUSTOM PROCEDURE TRAY) ×3 IMPLANT
PAD ARMBOARD 7.5X6 YLW CONV (MISCELLANEOUS) ×9 IMPLANT
PUTTY GRAFTON DBF 6CC W/DELIVE (Putty) ×3 IMPLANT
ROD COBALT 47.5X35 (Rod) ×6 IMPLANT
SCREW SET SOLERA (Screw) ×12 IMPLANT
SCREW SET SOLERA TI (Screw) ×4 IMPLANT
SCREW SOLERA 6.5X35 (Screw) ×12 IMPLANT
SPONGE SURGIFOAM ABS GEL 100 (HEMOSTASIS) IMPLANT
SPONGE T-LAP 4X18 ~~LOC~~+RFID (SPONGE) ×3 IMPLANT
STRIP CLOSURE SKIN 1/2X4 (GAUZE/BANDAGES/DRESSINGS) IMPLANT
SUT VIC AB 0 CT1 18XCR BRD8 (SUTURE) ×3 IMPLANT
SUT VIC AB 0 CT1 8-18 (SUTURE) ×9
SUT VICRYL 3-0 RB1 18 ABS (SUTURE) ×6 IMPLANT
SYR 3ML LL SCALE MARK (SYRINGE) ×9 IMPLANT
TOWEL GREEN STERILE (TOWEL DISPOSABLE) ×3 IMPLANT
TOWEL GREEN STERILE FF (TOWEL DISPOSABLE) ×3 IMPLANT
TRAY FOLEY MTR SLVR 16FR STAT (SET/KITS/TRAYS/PACK) ×3 IMPLANT
WATER STERILE IRR 1000ML POUR (IV SOLUTION) ×3 IMPLANT

## 2021-09-23 NOTE — Anesthesia Postprocedure Evaluation (Signed)
Anesthesia Post Note  Patient: Kenneth Weaver  Procedure(s) Performed: Lumbar Four- Five Posterior Lumbar Interbody Fusion with Left Lumbar Five- Sacral One Laminectomy Foraminotomy (Left)     Patient location during evaluation: PACU Anesthesia Type: General Level of consciousness: awake and alert Pain management: pain level controlled Vital Signs Assessment: post-procedure vital signs reviewed and stable Respiratory status: spontaneous breathing, nonlabored ventilation, respiratory function stable and patient connected to nasal cannula oxygen Cardiovascular status: blood pressure returned to baseline and stable Postop Assessment: no apparent nausea or vomiting Anesthetic complications: no   No notable events documented.  Last Vitals:  Vitals:   09/23/21 1720 09/23/21 1735  BP: (!) 144/71 (!) 160/86  Pulse: 70 63  Resp: 14 11  Temp:    SpO2: 97% 97%    Last Pain:  Vitals:   09/23/21 1735  TempSrc:   PainSc: 0-No pain                 Chandlar Guice S

## 2021-09-23 NOTE — Progress Notes (Signed)
PHARMACIST - PHYSICIAN ORDER COMMUNICATION  CONCERNING: P&T Medication Policy on Herbal Medications  DESCRIPTION:  This patient's order for:  Zinc 30 mg tablets  has been noted.  This product(s) is classified as an "herbal" or natural product. Due to a lack of definitive safety studies or FDA approval, nonstandard manufacturing practices, plus the potential risk of unknown drug-drug interactions while on inpatient medications, the Pharmacy and Therapeutics Committee does not permit the use of "herbal" or natural products of this type within [facility].   ACTION TAKEN: The pharmacy department is unable to verify this order at this time and your patient has been informed of this safety policy. Please reevaluate patient's clinical condition at discharge and address if the herbal or natural product(s) should be resumed at that time.

## 2021-09-23 NOTE — Transfer of Care (Signed)
Immediate Anesthesia Transfer of Care Note  Patient: Kenneth Weaver  Procedure(s) Performed: Lumbar Four- Five Posterior Lumbar Interbody Fusion with Left Lumbar Five- Sacral One Laminectomy Foraminotomy (Left)  Patient Location: PACU  Anesthesia Type:General  Level of Consciousness: drowsy  Airway & Oxygen Therapy: Patient Spontanous Breathing and Patient connected to face mask oxygen  Post-op Assessment: Report given to RN, Post -op Vital signs reviewed and stable and Patient moving all extremities X 4  Post vital signs: Reviewed and stable  Last Vitals:  Vitals Value Taken Time  BP 162/69 09/23/21 1703  Temp    Pulse 58 09/23/21 1707  Resp 10 09/23/21 1707  SpO2 99 % 09/23/21 1707  Vitals shown include unvalidated device data.  Last Pain:  Vitals:   09/23/21 1021  TempSrc:   PainSc: 1       Patients Stated Pain Goal: 0 (35/36/14 4315)  Complications: No notable events documented.

## 2021-09-23 NOTE — H&P (Signed)
Chief Complaint  Back and leg pain  History of Present Illness  Kenneth Weaver is a 75 y.o. male initially seen in the outpatient neurosurgery clinic by my partner, Dr. Vertell Limber.  He complains primarily of back and left-sided leg pain for approximately 1 year.  Symptoms have slowly but progressively been worsening.  He describes the pain which worsens when he stands or walks.  He has attempted over-the-counter anti-inflammatory medication without improvement.  He has also undergone 2 epidural steroid injections with the first providing several weeks of improvement, the second only a few days.  Past Medical History   Past Medical History:  Diagnosis Date   ALLERGIC RHINITIS 08/09/2008   Qualifier: Diagnosis of  By: Doy Mince LPN, Megan     Arthritis    joints, generalized   Cancer ([place])    skin cancer excision right arm 2'16   Duodenal ulcer    80's   G E R D 08/09/2008   Qualifier: Diagnosis of  By: Doy Mince LPN, Megan     Heart murmur    Hemorrhoids    Hx of gastric ulcer    HYPERLIPIDEMIA 08/09/2008   Qualifier: Diagnosis of  By: Doy Mince LPN, Megan     Hypertension    HYPERTENSION 08/09/2008   Qualifier: Diagnosis of  By: Doy Mince LPN, Megan     LOW HDL 08/09/2008   Qualifier: Diagnosis of  By: Doy Mince LPN, Megan     Mitral valve prolapse    Multiple pulmonary nodules 02/12/2015   Followed in Pulmonary clinic/ [facility]/ [place]/ never smoker - See CT chest Jan 18 2015 [facility] Multiple tree in bud nodules plus one macrolobulated nodule 1.4 cm LUL  - f/u ov 11/12/2015 no change/ no symtoms > f/u prn     Prostate cancer ([place]) 01/24/2015    Past Surgical History   Past Surgical History:  Procedure Laterality Date   CARDIAC CATHETERIZATION     '61-[facility] age 55, evaluate heart murmur-dx."MVP"   HERNIA REPAIR     laparoscopic   LYMPHADENECTOMY Bilateral 01/24/2015   Procedure: LYMPHADENECTOMY;  Surgeon: Raynelle Bring, MD;  Location: [facility];  Service: Urology;   Laterality: Bilateral;   PROSTATE BIOPSY  01/24/2015   ROBOT ASSISTED LAPAROSCOPIC RADICAL PROSTATECTOMY N/A 01/24/2015   Procedure: ROBOTIC ASSISTED LAPAROSCOPIC RADICAL PROSTATECTOMY LEVEL 3;  Surgeon: Raynelle Bring, MD;  Location: [facility];  Service: Urology;  Laterality: N/A;   SALIVARY STONE REMOVAL     TEAR DUCT PROBING Left 2022    Social History   Social History   Tobacco Use   Smoking status: Never   Smokeless tobacco: Never  Vaping Use   Vaping Use: Never used  Substance Use Topics   Alcohol use: No   Drug use: No    Medications   Prior to Admission medications   Medication Sig Start Date End Date Taking? Authorizing Provider  acetaminophen (TYLENOL) 500 MG tablet Take 1,000 mg by mouth in the morning, at noon, and at bedtime.   Yes [provider]  amLODipine (NORVASC) 2.5 MG tablet Take 2.5 mg by mouth daily with lunch. 07/17/21  Yes [provider]  aspirin EC 81 MG tablet Take 81 mg by mouth daily. Swallow whole.   Yes [provider]  Calcium 600-200 MG-UNIT tablet Take 1 tablet by mouth daily.   Yes [provider]  chlorpheniramine (CHLOR-TRIMETON) 4 MG tablet Take 4 mg by mouth 2 (two) times daily as needed for allergies.   Yes [provider]  Cholecalciferol (VITAMIN D) 2000 UNITS CAPS Take 2,000 Units by mouth daily.   Yes [provider]  CINNAMON PO Take 1 capsule by mouth every 7 (seven) days.   Yes [provider]  fluticasone (FLONASE) 50 MCG/ACT nasal spray Place 1 spray into both nostrils daily as needed for allergies or rhinitis.   Yes [provider]  Glucosamine HCl-MSM (GLUCOSAMINE-MSM PO) Take 1 tablet by mouth in the morning and at bedtime.   Yes [provider]  hydrocortisone cream 1 % Apply 1 application topically daily as needed for itching.   Yes [provider]  Magnesium 200 MG TABS Take 200 mg by mouth in the morning and at bedtime.   Yes [provider]  olmesartan (BENICAR) 40 MG tablet Take 40 mg by mouth daily with lunch.   Yes [provider]  Omega-3 Fatty Acids (FISH OIL) 1000 MG CAPS Take 1,000 mg by mouth in the morning and at bedtime.   Yes [provider]  Probiotic Product (PROBIOTIC PO) Take 1 capsule by mouth daily.   Yes [provider]  Psyllium (METAMUCIL PO) Take 3 scoop by mouth every morning.   Yes [provider]  sildenafil (REVATIO) 20 MG tablet Take 20 mg by mouth daily as needed (erectile dysfunction).   Yes [provider]  TURMERIC PO Take 1 capsule by mouth in the morning and at bedtime.   Yes [provider]  vitamin B-12 (CYANOCOBALAMIN) 500 MCG tablet Take 500 mcg by mouth in the morning and at bedtime.   Yes [provider]  vitamin C (ASCORBIC ACID) 500 MG tablet Take 500 mg by mouth daily.   Yes [provider]  Zinc 30 MG TABS Take 30 mg by mouth every 7 (seven) days.   Yes [provider]    Allergies   Allergies  Allergen Reactions   Celebrex [Celecoxib] Other (See Comments)    Stomach distress + ulcer.   Crestor [Rosuvastatin] Other (See Comments)    Sore muscle. + Pravastatin    Diltiazem     Heart pounding, low pulse.    Ramipril Other (See Comments)    Heart pounding, sleeplessness   Reglan [Metoclopramide] Other (See Comments)    Sleeplessness, Jittery, "felt like crawling out of skin"   Losartan Rash    Didn't work    Tetracyclines & Related Rash    Review of Systems  ROS  Neurologic Exam  Awake, alert, oriented Memory and concentration grossly intact Speech fluent, appropriate CN grossly intact Motor exam: Upper Extremities Deltoid Bicep Tricep Grip  Right 5/5 5/5 5/5 5/5  Left 5/5 5/5 5/5 5/5   Lower Extremities IP Quad PF DF EHL  Right 5/5 5/5 5/5 5/5 5/5  Left 5/5 5/5 5/5 5/5 5/5   Sensation grossly intact to LT  Imaging  MRI dated 06/11/2021 was reviewed and demonstrates  grade 1 anterolisthesis of L4 and L5.  There is significant bilateral facet arthropathy and moderate bilateral lateral recess stenosis.  There is some mild to moderate left-sided lateral recess stenosis on the left at L5-S1.  Impression  - 75 y.o. male with worsening back and primarily left-sided leg pain related to spondylolisthesis with stenosis at L4-5 and left-sided lateral recess stenosis at L5-S1.  Plan  -We will plan on proceeding with decompression and fusion at L4-5 and left-sided laminotomy foraminotomies at L5-S1.  I have reviewed the details of the surgery as well as the expected postoperative course and recovery  with the patient in the office.  We have discussed the associated risks, benefits, and alternatives to surgery.  All his questions were answered and he provided informed consent to proceed as above.  Consuella Lose, MD [facility]

## 2021-09-23 NOTE — Anesthesia Procedure Notes (Signed)
Procedure Name: Intubation Date/Time: 09/23/2021 1:17 PM Performed by: Renato Shin, CRNA Pre-anesthesia Checklist: Patient identified, Emergency Drugs available, Suction available and Patient being monitored Patient Re-evaluated:Patient Re-evaluated prior to induction Oxygen Delivery Method: Circle system utilized Preoxygenation: Pre-oxygenation with 100% oxygen Induction Type: IV induction Ventilation: Mask ventilation without difficulty Laryngoscope Size: Miller and 3 Grade View: Grade I Tube type: Oral Tube size: 7.5 mm Number of attempts: 1 Airway Equipment and Method: Stylet and Oral airway Placement Confirmation: ETT inserted through vocal cords under direct vision, positive ETCO2 and breath sounds checked- equal and bilateral Secured at: 24 cm Tube secured with: Tape Dental Injury: Teeth and Oropharynx as per pre-operative assessment

## 2021-09-24 LAB — BASIC METABOLIC PANEL
Anion gap: 7 (ref 5–15)
BUN: 10 mg/dL (ref 8–23)
CO2: 28 mmol/L (ref 22–32)
Calcium: 9.2 mg/dL (ref 8.9–10.3)
Chloride: 98 mmol/L (ref 98–111)
Creatinine, Ser: 0.93 mg/dL (ref 0.61–1.24)
GFR, Estimated: >60 mL/min (ref >60)
Glucose, Bld: 162 mg/dL — ABNORMAL HIGH (ref 70–99)
Potassium: 4.2 mmol/L (ref 3.5–5.1)
Sodium: 133 mmol/L — ABNORMAL LOW (ref 135–145)

## 2021-09-24 LAB — CBC
HCT: 39 % (ref 39.0–52.0)
Hemoglobin: 13.1 g/dL (ref 13.0–17.0)
MCH: 30.5 pg (ref 26.0–34.0)
MCHC: 33.6 g/dL (ref 30.0–36.0)
MCV: 90.7 fL (ref 80.0–100.0)
Platelets: 276 10*3/uL (ref 150–400)
RBC: 4.3 MIL/uL (ref 4.22–5.81)
RDW: 12.4 % (ref 11.5–15.5)
WBC: 11.1 10*3/uL — ABNORMAL HIGH (ref 4.0–10.5)
nRBC: 0 % (ref 0.0–0.2)

## 2021-09-24 MED ORDER — METHOCARBAMOL 500 MG PO TABS
500.0000 mg | ORAL_TABLET | Freq: Four times a day (QID) | ORAL | 0 refills | Status: DC | PRN
Start: 2021-09-24 — End: 2022-02-19

## 2021-09-24 MED ORDER — OXYCODONE HCL 10 MG PO TABS: 10.0000 mg | ORAL_TABLET | ORAL | 0 refills | Status: AC | PRN

## 2021-09-24 NOTE — Progress Notes (Signed)
Orthopedic Tech Progress Note Patient Details:  Kenneth Weaver 04-18-1946 [id number]  [facility] stated "patient has back brace"  Patient ID: [name], male   DOB: 12/17/1945, 75 y.o.   MRN: [id number]  [name] 09/24/2021, 8:38 AM

## 2021-09-24 NOTE — Discharge Summary (Signed)
Physician Discharge Summary  Patient ID: Kenneth Weaver MRN: [id number] DOB/AGE: 1946-03-02 75 y.o.  Admit date: 09/23/2021 Discharge date: 09/24/2021  Admission Diagnoses:  Spondylolisthesis L4-5  Discharge Diagnoses:  Same Active Problems:   Spondylolisthesis at L4-L5 level   Discharged Condition: Stable  Hospital Course:  Kenneth Weaver is a 75 y.o. male admitted after uncomplicated P2-3 fusion with left L5-S1 decompression. He was at neurologic baseline postop with improvement in back and leg pain. He was ambulating without assistive device, tolerating diet, and voiding normally with pain under control.  Treatments: Surgery - L4-5 decompression/fusion with Left L5-S1 laminotomy  Discharge Exam: Blood pressure 133/66, pulse 62, temperature 98.4 F (36.9 C), temperature source Oral, resp. rate 18, height 5\' 10"  (1.778 m), weight 68.9 kg, SpO2 99 %. Awake, alert, oriented Speech fluent, appropriate CN grossly intact 5/5 BUE/BLE Wound c/d/i  Disposition: Discharge disposition: 01-Home or Self Care       Discharge Instructions     Call MD for:  redness, tenderness, or signs of infection (pain, swelling, redness, odor or green/yellow discharge around incision site)   Complete by: As directed    Call MD for:  temperature >100.4   Complete by: As directed    Diet - low sodium heart healthy   Complete by: As directed    Discharge instructions   Complete by: As directed    Walk at home as much as possible, at least 4 times / day   Increase activity slowly   Complete by: As directed    Lifting restrictions   Complete by: As directed    No lifting > 10 lbs   May shower / Bathe   Complete by: As directed    48 hours after surgery   May walk up steps   Complete by: As directed    Other Restrictions   Complete by: As directed    No bending/twisting at waist   Remove dressing in 24 hours   Complete by: As directed       Allergies as of 09/24/2021        Reactions   Celebrex [celecoxib] Other (See Comments)   Stomach distress + ulcer.   Crestor [rosuvastatin] Other (See Comments)   Sore muscle. + Pravastatin    Diltiazem    Heart pounding, low pulse.    Ramipril Other (See Comments)   Heart pounding, sleeplessness   Reglan [metoclopramide] Other (See Comments)   Sleeplessness, Jittery, "felt like crawling out of skin"   Losartan Rash   Didn't work    Tetracyclines & Related Rash        Medication List     TAKE these medications    acetaminophen 500 MG tablet Commonly known as: TYLENOL Take 1,000 mg by mouth in the morning, at noon, and at bedtime.   amLODipine 2.5 MG tablet Commonly known as: NORVASC Take 2.5 mg by mouth daily with lunch.   aspirin EC 81 MG tablet Take 81 mg by mouth daily. Swallow whole.   Calcium 600-200 MG-UNIT tablet Take 1 tablet by mouth daily.   chlorpheniramine 4 MG tablet Commonly known as: CHLOR-TRIMETON Take 4 mg by mouth 2 (two) times daily as needed for allergies.   CINNAMON PO Take 1 capsule by mouth every 7 (seven) days.   Fish Oil 1000 MG Caps Take 1,000 mg by mouth in the morning and at bedtime.   fluticasone 50 MCG/ACT nasal spray Commonly known as: FLONASE Place 1 spray into both nostrils daily as  needed for allergies or rhinitis.   GLUCOSAMINE-MSM PO Take 1 tablet by mouth in the morning and at bedtime.   hydrocortisone cream 1 % Apply 1 application topically daily as needed for itching.   Magnesium 200 MG Tabs Take 200 mg by mouth in the morning and at bedtime.   METAMUCIL PO Take 3 scoop by mouth every morning.   methocarbamol 500 MG tablet Commonly known as: ROBAXIN Take 1 tablet (500 mg total) by mouth every 6 (six) hours as needed for muscle spasms.   olmesartan 40 MG tablet Commonly known as: BENICAR Take 40 mg by mouth daily with lunch.   Oxycodone HCl 10 MG Tabs Take 1 tablet (10 mg total) by mouth every 4 (four) hours as needed for severe pain  ((score 7 to 10)).   PROBIOTIC PO Take 1 capsule by mouth daily.   sildenafil 20 MG tablet Commonly known as: REVATIO Take 20 mg by mouth daily as needed (erectile dysfunction).   TURMERIC PO Take 1 capsule by mouth in the morning and at bedtime.   vitamin B-12 500 MCG tablet Commonly known as: CYANOCOBALAMIN Take 500 mcg by mouth in the morning and at bedtime.   vitamin C 500 MG tablet Commonly known as: ASCORBIC ACID Take 500 mg by mouth daily.   Vitamin D 50 MCG (2000 UT) Caps Take 2,000 Units by mouth daily.   Zinc 30 MG Tabs Take 30 mg by mouth every 7 (seven) days.        Follow-up Information     Consuella Lose, MD Follow up.   Specialty: Neurosurgery Contact information: [address] [place] [address] [phone]                 Signed: Jairo Ben 09/24/2021, 10:02 AM

## 2021-09-24 NOTE — Evaluation (Signed)
Occupational Therapy Evaluation Patient Details Name: Kenneth Weaver MRN: [id number] DOB: 1946/08/08 Today's Date: 09/24/2021   History of Present Illness Pt is a 75 y.o. M who presents s/p L4-5 PLIF with left L5-S1 laminectomy, foraminotomy 09/23/2021. Significant PMH: CA, HTN.   Clinical Impression   Pt admitted for procedure listed above. PTA pt reported that he was independent with all ADL's and IADL's, just requiring increased time and breaks. At this time, pt reported that his pain is minimal and he feels great. He is able to complete all ADL's with independence and was educated on compensatory strategies for ADL's and IADL's to ensure safety and spinal precautions are followed. All questions were answered and all needs met. Pt has no further OT needs and acute OT will sign off.       Recommendations for follow up therapy are one component of a multi-disciplinary discharge planning process, led by the attending physician.  Recommendations may be updated based on patient status, additional functional criteria and insurance authorization.   Follow Up Recommendations  No OT follow up    Assistance Recommended at Discharge None  Functional Status Assessment  Patient has not had a recent decline in their functional status  Equipment Recommendations  BSC    Recommendations for Other Services       Precautions / Restrictions Precautions Precautions: Back;Fall Precaution Booklet Issued: Yes (comment) Precaution Comments: verbally reviewed, provided written handout Required Braces or Orthoses: Spinal Brace Spinal Brace: Lumbar corset;Applied in sitting position Restrictions Weight Bearing Restrictions: No      Mobility Bed Mobility Overal bed mobility: Modified Independent             General bed mobility comments: good log roll technique    Transfers Overall transfer level: Needs assistance Equipment used: None Transfers: Sit to/from Stand Sit to Stand: Supervision            General transfer comment: increased time to rise and gain full knee extension      Balance Overall balance assessment: Needs assistance Sitting-balance support: Feet supported Sitting balance-Leahy Scale: Normal     Standing balance support: No upper extremity supported;During functional activity Standing balance-Leahy Scale: Good                             ADL either performed or assessed with clinical judgement   ADL Overall ADL's : Modified independent                                       General ADL Comments: Pt demonstrated independence with dressing, toileting, and grooming this session, as well as adequate balance and ROM to complete bathing. Pt educated on compensatory strategies to safely and effectively complete ADL's while following spinal precautions     Vision Baseline Vision/History: 0 No visual deficits Ability to See in Adequate Light: 0 Adequate Patient Visual Report: No change from baseline Vision Assessment?: No apparent visual deficits     Perception     Praxis      Pertinent Vitals/Pain Pain Assessment: Faces Faces Pain Scale: Hurts a little bit Pain Location: back Pain Descriptors / Indicators: Operative site guarding Pain Intervention(s): Monitored during session     Hand Dominance Right   Extremity/Trunk Assessment Upper Extremity Assessment Upper Extremity Assessment: Overall WFL for tasks assessed   Lower Extremity Assessment Lower Extremity Assessment: Defer to  PT evaluation RLE Deficits / Details: strength 5/5 LLE Deficits / Details: strength 5/5   Cervical / Trunk Assessment Cervical / Trunk Assessment: Back Surgery   Communication Communication Communication: No difficulties   Cognition Arousal/Alertness: Awake/alert Behavior During Therapy: WFL for tasks assessed/performed Overall Cognitive Status: Within Functional Limits for tasks assessed                                        General Comments       Exercises     Shoulder Instructions      Home Living Family/patient expects to be discharged to:: Private residence Living Arrangements: Spouse/significant other Available Help at Discharge: Family Type of Home: House Home Access: Stairs to enter Technical brewer of Steps: 2   Home Layout: One level     Bathroom Shower/Tub: Occupational psychologist: [place]: Conservation officer, nature (2 wheels);Cane - single point;Grab bars - tub/shower          Prior Functioning/Environment Prior Level of Function : Independent/Modified Independent             Mobility Comments: Retired Theme park manager, 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 longer distances ADLs Comments: able to complete all ADL's with rest breaks        OT Problem List: Decreased strength;Decreased activity tolerance;Impaired balance (sitting and/or standing);Pain      OT Treatment/Interventions:      OT Goals(Current goals can be found in the care plan section) Acute Rehab OT Goals Patient Stated Goal: To go home OT Goal Formulation: With patient Time For Goal Achievement: 09/24/21 Potential to Achieve Goals: Good  OT Frequency:     Barriers to D/C:            Co-evaluation              AM-PAC OT "6 Clicks" Daily Activity     Outcome Measure Help from another person eating meals?: None Help from another person taking care of personal grooming?: None Help from another person toileting, which includes using toliet, bedpan, or urinal?: None Help from another person bathing (including washing, rinsing, drying)?: None Help from another person to put on and taking off regular upper body clothing?: None Help from another person to put on and taking off regular lower body clothing?: None 6 Click Score: 24   End of Session Equipment Utilized During Treatment: Back brace Nurse Communication: Mobility status  Activity  Tolerance: Patient tolerated treatment well Patient left: in bed;with call bell/phone within reach;with family/visitor present  OT Visit Diagnosis: Unsteadiness on feet (R26.81);Other abnormalities of gait and mobility (R26.89);Muscle weakness (generalized) (M62.81)                Time: 1610-9604 OT Time Calculation (min): 53 min Charges:  OT General Charges $OT Visit: 1 Visit OT Evaluation $OT Eval Low Complexity: 1 Low OT Treatments $Self Care/Home Management : 38-52 mins  Swan Zayed H., OTR/L Acute Rehabilitation  Sima Lindenberger Elane Tramya Schoenfelder 09/24/2021, 10:43 AM

## 2021-09-24 NOTE — Evaluation (Signed)
Physical Therapy Evaluation and Discharge Patient Details Name: Kenneth Weaver MRN: [id number] DOB: October 13, 1946 Today's Date: 09/24/2021  History of Present Illness  Pt is a 75 y.o. M who presents s/p L4-5 PLIF with left L5-S1 laminectomy, foraminotomy 09/23/2021. Significant PMH: CA, HTN.  Clinical Impression  Patient evaluated by Physical Therapy with no further acute PT needs identified. Pt denies radicular pain or numbness/tingling. Pt ambulating 400 feet with no assistive device and negotiated 3 steps with a railing without physical difficulty. Education provided regarding brace use, spinal precautions, exercise recommendations. All education has been completed and the patient has no further questions. See below for any follow-up Physical Therapy or equipment needs. PT is signing off. Thank you for this referral.      Recommendations for follow up therapy are one component of a multi-disciplinary discharge planning process, led by the attending physician.  Recommendations may be updated based on patient status, additional functional criteria and insurance authorization.  Follow Up Recommendations No PT follow up    Assistance Recommended at Discharge PRN  Functional Status Assessment Patient has had a recent decline in their functional status and demonstrates the ability to make significant improvements in function in a reasonable and predictable amount of time.  Equipment Recommendations  3in1 (PT)    Recommendations for Other Services       Precautions / Restrictions Precautions Precautions: Back;Fall Precaution Booklet Issued: Yes (comment) Precaution Comments: verbally reviewed, provided written handout Required Braces or Orthoses: Spinal Brace Spinal Brace: Lumbar corset;Applied in sitting position Restrictions Weight Bearing Restrictions: No      Mobility  Bed Mobility Overal bed mobility: Modified Independent             General bed mobility comments: good log  roll technique    Transfers Overall transfer level: Needs assistance Equipment used: None Transfers: Sit to/from Stand Sit to Stand: Supervision           General transfer comment: increased time to rise and gain full knee extension    Ambulation/Gait Ambulation/Gait assistance: Supervision Gait Distance (Feet): 400 Feet Assistive device: None Gait Pattern/deviations: Step-through pattern;Decreased stride length Gait velocity: decreased   General Gait Details: Decreased bilateral foot clearance, forward head posture and rounded shoulders; able to correct minimally with cues.  Stairs Stairs: Yes Stairs assistance: Modified independent (Device/Increase time) Stair Management: One rail Right Number of Stairs: 3 General stair comments: cues for step by step  Wheelchair Mobility    Modified Rankin (Stroke Patients Only)       Balance Overall balance assessment: Needs assistance Sitting-balance support: Feet supported Sitting balance-Leahy Scale: Normal     Standing balance support: No upper extremity supported;During functional activity Standing balance-Leahy Scale: Good                               Pertinent Vitals/Pain Pain Assessment: Faces Faces Pain Scale: Hurts a little bit Pain Location: back Pain Descriptors / Indicators: Operative site guarding Pain Intervention(s): Monitored during session    Home Living Family/patient expects to be discharged to:: Private residence Living Arrangements: Spouse/significant other Available Help at Discharge: Family Type of Home: House Home Access: Stairs to enter   Technical brewer of Steps: 2   Home Layout: One level Home Equipment: Conservation officer, nature (2 wheels);Cane - single point;Grab bars - tub/shower      Prior Function Prior Level of Function : Independent/Modified Independent  Mobility Comments: Retired Theme park manager, 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 longer distances       Hand Dominance        Extremity/Trunk Assessment   Upper Extremity Assessment Upper Extremity Assessment: Overall WFL for tasks assessed    Lower Extremity Assessment Lower Extremity Assessment: RLE deficits/detail;LLE deficits/detail RLE Deficits / Details: strength 5/5 LLE Deficits / Details: strength 5/5    Cervical / Trunk Assessment Cervical / Trunk Assessment: Back Surgery  Communication   Communication: No difficulties  Cognition Arousal/Alertness: Awake/alert Behavior During Therapy: WFL for tasks assessed/performed Overall Cognitive Status: Within Functional Limits for tasks assessed                                          General Comments      Exercises     Assessment/Plan    PT Assessment Patient does not need any further PT services  PT Problem List         PT Treatment Interventions      PT Goals (Current goals can be found in the Care Plan section)  Acute Rehab PT Goals Patient Stated Goal: stand and walk for longer periods PT Goal Formulation: All assessment and education complete, DC therapy    Frequency     Barriers to discharge        Co-evaluation               AM-PAC PT "6 Clicks" Mobility  Outcome Measure Help needed turning from your back to your side while in a flat bed without using bedrails?: None Help needed moving from lying on your back to sitting on the side of a flat bed without using bedrails?: None Help needed moving to and from a bed to a chair (including a wheelchair)?: A Little Help needed standing up from a chair using your arms (e.g., wheelchair or bedside chair)?: A Little Help needed to walk in hospital room?: A Little Help needed climbing 3-5 steps with a railing? : None 6 Click Score: 21    End of Session Equipment Utilized During Treatment: Back brace Activity Tolerance: Patient tolerated treatment well Patient left: in bed;with call bell/phone within  reach;with family/visitor present Nurse Communication: Mobility status PT Visit Diagnosis: Difficulty in walking, not elsewhere classified (R26.2);Pain Pain - part of body:  (back)    Time: 6553-7482 PT Time Calculation (min) (ACUTE ONLY): 21 min   Charges:   PT Evaluation $PT Eval Low Complexity: [place], PT, DPT Acute Rehabilitation Services Pager [phone] Office [phone]   Deno Etienne 09/24/2021, 9:39 AM

## 2021-09-24 NOTE — Progress Notes (Signed)
  NEUROSURGERY PROGRESS NOTE   No issues overnight. Pt reports appropriate back soreness, minimal leg pain. Overall significantly improved from preop.  EXAM:  BP 133/66 (BP Location: Left Arm)   Pulse 62   Temp 98.4 F (36.9 C) (Oral)   Resp 18   Ht 5\' 10"  (1.778 m)   Wt 68.9 kg   SpO2 99%   BMI 21.81 kg/m   Awake, alert, oriented  Speech fluent, appropriate  CN grossly intact  5/5 BUE/BLE   IMPRESSION:  75 y.o. male POD#1 L4-5 fusion, doing well  PLAN: - d/c home today   Consuella Lose, MD [facility]

## 2021-09-24 NOTE — Plan of Care (Signed)
Pt and family given D/C instructions with verbal understanding. Rx's were sent to the pharmacy by MD. Pt's incision is clean and dry with no sign of infection. Pt's IV was removed prior to D/C. Pt D/C'd home via wheelchair per MD order. Pt is stable @ D/C and has no other needs at this time. Holli Humbles, RN

## 2021-09-26 NOTE — Op Note (Signed)
NEUROSURGERY OPERATIVE NOTE   PREOP DIAGNOSIS:  1. 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 Lumbar stenosis, L4-5, L5-S1  POSTOP DIAGNOSIS: Same  PROCEDURE: 1. L4 laminectomy with facetectomy for decompression of exiting nerve roots, more than would be required for placement of interbody graft 2. Left L5-S1 extraforaminal laminotomy/foraminotomy 3. Placement of anterior interbody device - Medtronic 47mm expandable cage x2 4. Posterior non-segmental instrumentation using cortical pedicle screws at L4 - L5 5. Interbody arthrodesis, L4-5 6. Use of locally harvested bone autograft 7. Use of non-structural bone allograft - DBX, ProteOs  SURGEON: Dr. Consuella Lose, MD  ASSISTANT: Dr. Elwin Sleight, MD  ANESTHESIA: General Endotracheal  EBL: see anesthesia records  SPECIMENS: None  DRAINS: None  COMPLICATIONS: None immediate  CONDITION: Hemodynamically stable to PACU  HISTORY: Kenneth Weaver is a 75 y.o. male who has been followed in the outpatient clinic with back and primarily left-sided leg pain for approximately 1 year.  He describes slow but progressive worsening of symptoms.  Multiple conservative treatments were attempted without significant improvement and he ultimately elected to proceed with surgical decompression and fusion. Risks, benefits, and alternative treatments were reviewed in detail in the office. After all questions were answered, informed consent was obtained and witnessed.  PROCEDURE IN DETAIL: The patient was brought to the operating room via stretcher. After induction of general anesthesia, the patient was positioned on the operative table in the prone position. All pressure points were meticulously padded. Incision was then marked out and prepped and draped in the usual sterile fashion.  After timeout was conducted, skin was infiltrated with local anesthetic. Skin incision was then made sharply and Bovie electrocautery was used to dissect the subcutaneous tissue until  the lumbodorsal fascia was identified and incised. The muscle was then elevated in the subperiosteal plane and the L4 lamina and L4-5 facet complexes were identified. Self-retaining retractors were then placed. Lateral fluoroscopy was taken with a dissector in the L4-5 interspace to confirm our location.  At this point attention was turned to decompression. Complete L4 laminectomy was completed with a high-speed drill and Kerrison punches.  Normal dura was identified.  A ball-tipped dissector was then used to identify the foramina bilaterally.  High-speed drill was used to cut across the pars interarticularis and the inferior articulating process of L4 was removed bilaterally.  The exiting nerve roots and the traversing nerve roots were then identified.  High-speed drill and Kerrison punches were used to remove the medial and superior aspect of the superior articulating process of L5 in order to fully decompress the foramen as well as the L4 and L5 nerve roots.  I was then able to easily pass a ball dissector in the ventral epidural space and underneath the bilateral L4 and L5 nerves indicating good decompression.   I then dissected more inferiorly on the patient's left side to identify the pars at L5.  Preoperative imaging did reveal primarily foraminal/extraforaminal stenosis on the left at L5-S1.  High-speed drill was used to remove the lateral portion of the L5 pars as well as the superior portion of the L5-S1 facet complex.  The intertransverse ligament was identified and removed.  I was then able to identify the foraminal portion of the L5 nerve root.  I was able to pass a ball dissector in the lateral aspect of the foramen indicating good decompression.  Disc space at L4-5 was then identified, incised bilaterally, and using a combination of shavers, curettes and rongeurs, complete discectomy was completed. Endplates were prepared with curettes, and  bone harvested during decompression was mixed with  ProteOs and packed into the interspace. A 24mm expandable cage was tapped into place bilaterally. Cages were expanded simultaneously to achieve good endplate apposition and preserved lumbar lordosis.  Good position was confirmed with fluoroscopy.  At this point, the entry points for bilateral L4 and L5 cortical pedicle screws were identified using standard anatomic landmarks and lateral fluoro. Pilot holes were then drilled and tapped to 5.5 x 35 mm. Screws were then placed in L4 and L5 bilaterally. Prebent lordotic rod was then sized and placed into the pedicle screws. Set screws were placed and final tightened. Final AP and lateral fluoroscopic images confirmed good position.  Hemostasis was secured and confirmed with bipolar cautery and morcellized gelfoam with thrombin. The wound was then irrigated with copious amounts of antibiotic saline, then closed in standard fashion using a combination of interrupted 0 and 3-0 Vicryl stitches in the muscular, fascial, and subcutaneous layers. Skin was then closed using standard Dermabond. Sterile dressing was then applied. The patient was then transferred to the stretcher, extubated, and taken to the postanesthesia care unit in stable hemodynamic condition.  At the end of the case all sponge, needle, cottonoid, and instrument counts were correct.   Consuella Lose, MD [facility]

## 2021-10-01 ENCOUNTER — Encounter (HOSPITAL_COMMUNITY): Payer: Self-pay | Admitting: Neurosurgery

## 2021-10-13 DIAGNOSIS — M5416 Radiculopathy, lumbar region: Secondary | ICD-10-CM | POA: Diagnosis not present

## 2021-10-13 DIAGNOSIS — I10 Essential (primary) hypertension: Secondary | ICD-10-CM | POA: Diagnosis not present

## 2021-10-13 DIAGNOSIS — M4316 Spondylolisthesis, lumbar region: Secondary | ICD-10-CM | POA: Diagnosis not present

## 2021-10-20 DIAGNOSIS — Z23 Encounter for immunization: Secondary | ICD-10-CM | POA: Diagnosis not present

## 2021-12-10 ENCOUNTER — Ambulatory Visit: Payer: Medicare Other | Admitting: Cardiovascular Disease

## 2021-12-10 ENCOUNTER — Encounter: Payer: Self-pay | Admitting: Cardiovascular Disease

## 2021-12-10 ENCOUNTER — Other Ambulatory Visit: Payer: Self-pay

## 2021-12-10 VITALS — BP 152/68 | HR 58 | Ht 70.0 in | Wt 152.0 lb

## 2021-12-10 DIAGNOSIS — I10 Essential (primary) hypertension: Secondary | ICD-10-CM

## 2021-12-10 DIAGNOSIS — I351 Nonrheumatic aortic (valve) insufficiency: Secondary | ICD-10-CM

## 2021-12-10 DIAGNOSIS — I340 Nonrheumatic mitral (valve) insufficiency: Secondary | ICD-10-CM | POA: Diagnosis not present

## 2021-12-10 NOTE — Patient Instructions (Signed)
Medication Instructions:  No changes *If you need a refill on your cardiac medications before your next appointment, please call your pharmacy*   Lab Work: none   Testing/Procedures:  ECHO DUE IN SEPT 2023 Your physician has requested that you have an echocardiogram. Echocardiography is a painless test that uses sound waves to create images of your heart. It provides your doctor with information about the size and shape of your heart and how well your hearts chambers and valves are working. This procedure takes approximately one hour. There are no restrictions for this procedure.   Follow-Up: At [facility], you and your health needs are our priority.  As part of our continuing mission to provide you with exceptional heart care, we have created designated Provider Care Teams.  These Care Teams include your primary Cardiologist (physician) and Advanced Practice Providers (APPs -  Physician Assistants and Nurse Practitioners) who all work together to provide you with the care you need, when you need it.   Your next appointment:   12 month(s)  The format for your next appointment:   In Person  Provider:   Lauree Chandler, MD     Other Instructions

## 2021-12-10 NOTE — Progress Notes (Signed)
Chief Complaint  Patient presents with   Follow-up    Aortic valve disease   History of Present Illness: 76 yo male with history of HTN, HLD, prostate cancer, GERD, Barretts esophagus, erectile dysfunction, mitral valve prolapse, mitral regurgitation, aortic valve insufficiency who is here today for cardiac follow up. He has been followed in the past by Dr. Wynonia Lawman. He is intolerant of statins. Echo December 2017 with LVEF=55%. Mild LVH. Mild MR, Mild AI. I met him in August 2020. Last echo September 2020 with LVEF=60-65%, mild MVP with no MR, mild AI.   She is here today for follow up. The patient denies any chest pain, dyspnea, palpitations, lower extremity edema, orthopnea, PND, dizziness, near syncope or syncope.    Primary Care Physician: Antony Contras, MD  Past Medical History:  Diagnosis Date   ALLERGIC RHINITIS 08/09/2008   Qualifier: Diagnosis of  By: Doy Mince LPN, Megan     Arthritis    joints, generalized   Cancer ([place])    skin cancer excision right arm 2'16   Duodenal ulcer    80's   G E R D 08/09/2008   Qualifier: Diagnosis of  By: Doy Mince LPN, Megan     Heart murmur    Hemorrhoids    Hx of gastric ulcer    HYPERLIPIDEMIA 08/09/2008   Qualifier: Diagnosis of  By: Doy Mince LPN, Megan     Hypertension    HYPERTENSION 08/09/2008   Qualifier: Diagnosis of  By: Doy Mince LPN, Megan     LOW HDL 08/09/2008   Qualifier: Diagnosis of  By: Doy Mince LPN, Megan     Mitral valve prolapse    Multiple pulmonary nodules 02/12/2015   Followed in Pulmonary clinic/ [facility]/ [place]/ never smoker - See CT chest Jan 18 2015 [facility] Multiple tree in bud nodules plus one macrolobulated nodule 1.4 cm LUL  - f/u ov 11/12/2015 no change/ no symtoms > f/u prn     Prostate cancer ([place]) 01/24/2015    Past Surgical History:  Procedure Laterality Date   CARDIAC CATHETERIZATION     '61-[facility] age 69, evaluate heart murmur-dx."MVP"   HERNIA REPAIR     laparoscopic    LYMPHADENECTOMY Bilateral 01/24/2015   Procedure: LYMPHADENECTOMY;  Surgeon: Raynelle Bring, MD;  Location: [facility];  Service: Urology;  Laterality: Bilateral;   PROSTATE BIOPSY  01/24/2015   ROBOT ASSISTED LAPAROSCOPIC RADICAL PROSTATECTOMY N/A 01/24/2015   Procedure: ROBOTIC ASSISTED LAPAROSCOPIC RADICAL PROSTATECTOMY LEVEL 3;  Surgeon: Raynelle Bring, MD;  Location: [facility];  Service: Urology;  Laterality: N/A;   SALIVARY STONE REMOVAL     TEAR DUCT PROBING Left 2022    Current Outpatient Medications  Medication Sig Dispense Refill   acetaminophen (TYLENOL) 500 MG tablet Take 1,000 mg by mouth in the morning, at noon, and at bedtime.     amLODipine (NORVASC) 2.5 MG tablet Take 2.5 mg by mouth daily with lunch.     aspirin EC 81 MG tablet Take 81 mg by mouth daily. Swallow whole.     Calcium 600-200 MG-UNIT tablet Take 1 tablet by mouth daily.     chlorpheniramine (CHLOR-TRIMETON) 4 MG tablet Take 4 mg by mouth 2 (two) times daily as needed for allergies.     Cholecalciferol (VITAMIN D) 2000 UNITS CAPS Take 2,000 Units by mouth daily.     CINNAMON PO Take 1 capsule by mouth every 7 (seven) days.     fluticasone (FLONASE) 50 MCG/ACT nasal spray Place 1 spray into both  nostrils daily as needed for allergies or rhinitis.     Glucosamine HCl-MSM (GLUCOSAMINE-MSM PO) Take 1 tablet by mouth in the morning and at bedtime.     hydrocortisone cream 1 % Apply 1 application topically daily as needed for itching.     Magnesium 200 MG TABS Take 200 mg by mouth in the morning and at bedtime.     methocarbamol (ROBAXIN) 500 MG tablet Take 1 tablet (500 mg total) by mouth every 6 (six) hours as needed for muscle spasms. 60 tablet 0   olmesartan (BENICAR) 40 MG tablet Take 40 mg by mouth daily with lunch.     Omega-3 Fatty Acids (FISH OIL) 1000 MG CAPS Take 1,000 mg by mouth in the morning and at bedtime.     Probiotic Product (PROBIOTIC PO) Take 1 capsule by mouth daily.     Psyllium (METAMUCIL PO) Take 3  scoop by mouth every morning.     sildenafil (REVATIO) 20 MG tablet Take 20 mg by mouth daily as needed (erectile dysfunction).     TURMERIC PO Take 1 capsule by mouth in the morning and at bedtime.     vitamin B-12 (CYANOCOBALAMIN) 500 MCG tablet Take 500 mcg by mouth in the morning and at bedtime.     vitamin C (ASCORBIC ACID) 500 MG tablet Take 500 mg by mouth daily.     Zinc 30 MG TABS Take 30 mg by mouth every 7 (seven) days.     No current facility-administered medications for this visit.    Allergies  Allergen Reactions   Celebrex [Celecoxib] Other (See Comments)    Stomach distress + ulcer.   Crestor [Rosuvastatin] Other (See Comments)    Sore muscle. + Pravastatin    Diltiazem     Heart pounding, low pulse.    Ramipril Other (See Comments)    Heart pounding, sleeplessness   Reglan [Metoclopramide] Other (See Comments)    Sleeplessness, Jittery, "felt like crawling out of skin"   Losartan Rash    Didn't work    Tetracyclines & Related Rash    Social History   Socioeconomic History   Marital status: Married    Spouse name: Not on file   Number of children: 3   Years of education: Not on file   Highest education level: Not on file  Occupational History   Occupation: Retired-[employer]  Tobacco Use   Smoking status: Never   Smokeless tobacco: Never  Vaping Use   Vaping Use: Never used  Substance and Sexual Activity   Alcohol use: No   Drug use: No   Sexual activity: Not on file  Other Topics Concern   Not on file  Social History Narrative   Not on file   Social Determinants of Health   Financial Resource Strain: Not on file  Food Insecurity: Not on file  Transportation Needs: Not on file  Physical Activity: Not on file  Stress: Not on file  Social Connections: Not on file  Intimate Partner Violence: Not on file    Family History  Problem Relation Age of Onset   Hypertension Mother    Stroke Mother    Hypertension Father    Bladder Cancer Father     Diabetes Brother     Review of Systems:  As stated in the HPI and otherwise negative.   BP (!) 152/68    Pulse (!) 58    Ht '5\' 10"'  (1.778 m)    Wt 152 lb (68.9 kg)  SpO2 100%    BMI 21.81 kg/m   Physical Examination:  General: Well developed, well nourished, NAD  HEENT: OP clear, mucus membranes moist  SKIN: warm, dry. No rashes. Neuro: No focal deficits  Musculoskeletal: Muscle strength 5/5 all ext  Psychiatric: Mood and affect normal  Neck: No JVD, no carotid bruits, no thyromegaly, no lymphadenopathy.  Lungs:Clear bilaterally, no wheezes, rhonci, crackles Cardiovascular: Regular rate and rhythm. Soft systolic murmur.  Abdomen:Soft. Bowel sounds present. Non-tender.  Extremities: No lower extremity edema. Pulses are 2 + in the bilateral DP/PT.  EKG:  EKG is not ordered today. The ekg ordered today demonstrates   Echo September 2020:   1. The left ventricle has normal systolic function with an ejection  fraction of 60-65%. The cavity size was normal. Left ventricular diastolic  Doppler parameters are indeterminate.   2. The right ventricle has normal systolic function. The cavity was  mildly enlarged. There is No increase in right ventricular wall thickness.   3. Left atrial size was normal.   4. Right atrial size was normal.   5. Mild mitral valve prolapse.   6. The mitral valve is myxomatous. mild thickening of the mitral valve  leaflet. No evidence of mitral valve stenosis.   7. The aortic valve is tricuspid. Mild thickening of the aortic valve.  Mild calcification of the aortic valve. Aortic valve regurgitation is mild  by color flow Doppler.   8. Pulmonic valve regurgitation is mild by color flow Doppler.   9. The aorta is normal unless otherwise noted.   Recent Labs: 09/24/2021: BUN 10; Creatinine, Ser 0.93; Hemoglobin 13.1; Platelets 276; Potassium 4.2; Sodium 133   Lipid Panel No results found for: CHOL, TRIG, HDL, CHOLHDL, VLDL, LDLCALC, LDLDIRECT   Wt  Readings from Last 3 Encounters:  12/10/21 152 lb (68.9 kg)  09/23/21 152 lb (68.9 kg)  09/16/21 152 lb 3.2 oz (69 kg)     Assessment and Plan:   1. Mitral valve insufficiency: No significant insufficiency by echo in September 2020  2. Aortic valve insufficiency: Mild by echo in September 2020. Will repeat echo in September 2023.    3. HTN: BP is controlled. No changes today  Current medicines are reviewed at length with the patient today.  The patient does not have concerns regarding medicines.  The following changes have been made:  no change  Labs/ tests ordered today include:   Orders Placed This Encounter  Procedures   ECHOCARDIOGRAM COMPLETE   Disposition:   F/U with me in one year.   Signed, Lauree Chandler, MD 12/10/2021 3:32 PM    [facility] [address], [place], [place]  [address] Phone: [phone]; Fax: [phone]

## 2021-12-16 DIAGNOSIS — D485 Neoplasm of uncertain behavior of skin: Secondary | ICD-10-CM | POA: Diagnosis not present

## 2021-12-16 DIAGNOSIS — Z23 Encounter for immunization: Secondary | ICD-10-CM | POA: Diagnosis not present

## 2021-12-16 DIAGNOSIS — C44329 Squamous cell carcinoma of skin of other parts of face: Secondary | ICD-10-CM | POA: Diagnosis not present

## 2021-12-16 DIAGNOSIS — L570 Actinic keratosis: Secondary | ICD-10-CM | POA: Diagnosis not present

## 2022-01-14 DIAGNOSIS — M4316 Spondylolisthesis, lumbar region: Secondary | ICD-10-CM | POA: Diagnosis not present

## 2022-01-14 DIAGNOSIS — I10 Essential (primary) hypertension: Secondary | ICD-10-CM | POA: Diagnosis not present

## 2022-02-10 DIAGNOSIS — Z0000 Encounter for general adult medical examination without abnormal findings: Secondary | ICD-10-CM | POA: Diagnosis not present

## 2022-02-10 DIAGNOSIS — R001 Bradycardia, unspecified: Secondary | ICD-10-CM | POA: Diagnosis not present

## 2022-02-10 DIAGNOSIS — E7800 Pure hypercholesterolemia, unspecified: Secondary | ICD-10-CM | POA: Diagnosis not present

## 2022-02-10 DIAGNOSIS — R7303 Prediabetes: Secondary | ICD-10-CM | POA: Diagnosis not present

## 2022-02-10 DIAGNOSIS — I10 Essential (primary) hypertension: Secondary | ICD-10-CM | POA: Diagnosis not present

## 2022-02-12 DIAGNOSIS — Z1211 Encounter for screening for malignant neoplasm of colon: Secondary | ICD-10-CM | POA: Diagnosis not present

## 2022-02-13 DIAGNOSIS — C44329 Squamous cell carcinoma of skin of other parts of face: Secondary | ICD-10-CM | POA: Diagnosis not present

## 2022-02-16 ENCOUNTER — Telehealth: Payer: Self-pay | Admitting: Cardiovascular Disease

## 2022-02-16 NOTE — Telephone Encounter (Signed)
STAT if patient feels like he/she is going to faint  ? ?Are you dizzy now? yes ? ?Do you feel faint or have you passed out? no ? ?Do you have any other symptoms? About a month ago he had a dizzy spell that he almost passed out.  It comes and goes through out the day. When he feels a dizzy spells come on he sits down and relaxes. He states he feels lightheaded right now, he describes it as a mild dizziness. It feels sometimes like his head is spinning. He feels extremely weak, has no energy.  ? ?Have you checked your HR and BP (record if available)?  ?138/73 46 ?153/83 48 ?139/72 45 ?139/71 45 ?128/65 50 ?122/68 58 ?133/69 43 ?  ?

## 2022-02-16 NOTE — Telephone Encounter (Signed)
Pt reports episodes of lightheadedness noted about the last 5 weeks.   ? 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  Had episode of extreme dizziness/lightheadedness.  Thought he might pass out.  Ever since he has not been himself.  He's weak, sometimes more lightheaded than others.  It is not room spinning. Tired, weak, can't get strength back.  Saw PCP last week who wanted him to come back into cardiology. ? ?HR at home is running42-58.  It was 49 at PCP.  No ekg was done.   ? ?Seen by Dr. Angelena Form in Jan. Recovering from the back surgery at that time.   ? ?He is aware I will forward to Dr. Angelena Form and call him back with recommendations. ?

## 2022-02-16 NOTE — Telephone Encounter (Signed)
Reviewed with Dr. Angelena Form.  Added to DOD schedule Thursday AM 9:30.  Pt aware and appreciative for assistance. ?

## 2022-02-18 NOTE — Progress Notes (Signed)
? ?Chief Complaint  ?Patient presents with  ? Follow-up  ?  Dizziness, weakness  ? ?History of Present Illness: 76 yo male with history of HTN, HLD, prostate cancer, GERD, Barretts esophagus, erectile dysfunction, mitral valve prolapse, mitral regurgitation and aortic valve insufficiency who is here today for cardiac follow up. He has been followed in the past by Dr. Wynonia Lawman. He is intolerant of statins. Echo December 2017 with LVEF=55%. Mild LVH. Mild MR, Mild AI. I met him in August 2020. Last echo September 2020 with LVEF=60-65%, mild MVP with no MR, mild AI.  ? ?He called our office this week with c/o dizziness and weakness. 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 He has felt weak since then. Each day he feels dizzy. This lasts for up to a minute.  No chest pain or dyspnea. BMET, TSH and CBC normal last week in primary care. Heart rate has been in the 40s and 50s at home when he checks his BP.  ?  ?Primary Care Physician: Antony Contras, MD ? ?Past Medical History:  ?Diagnosis Date  ? ALLERGIC RHINITIS 08/09/2008  ? Qualifier: Diagnosis of  By: Doy Mince LPN, Megan    ? 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 Cancer [facility])   ? skin cancer excision right arm 2'16  ? Duodenal ulcer   ? 80's  ? G E R D 08/09/2008  ? Qualifier: Diagnosis of  By: Doy Mince LPN, Megan    ? Heart murmur   ? Hemorrhoids   ? Hx of gastric ulcer   ? HYPERLIPIDEMIA 08/09/2008  ? Qualifier: Diagnosis of  By: Doy Mince LPN, Megan    ? Hypertension   ? HYPERTENSION 08/09/2008  ? Qualifier: Diagnosis of  By: Doy Mince LPN, Megan    ? LOW HDL 08/09/2008  ? Qualifier: Diagnosis of  By: Doy Mince LPN, Megan    ? Mitral valve prolapse   ? Multiple pulmonary nodules 02/12/2015  ? Followed in Pulmonary clinic/ [facility]/ [place]/ never smoker - See CT chest Jan 18 2015 [facility] Multiple tree in bud nodules plus one macrolobulated nodule  1.4 cm LUL  - f/u ov 11/12/2015 no change/ no symtoms > f/u prn    ? Prostate cancer ([place]) 01/24/2015  ? ? ?Past Surgical History:  ?Procedure Laterality Date  ? CARDIAC CATHETERIZATION    ? '61-[facility] age 26, evaluate heart murmur-dx."MVP"  ? HERNIA REPAIR    ? laparoscopic  ? LYMPHADENECTOMY Bilateral 01/24/2015  ? Procedure: LYMPHADENECTOMY;  Surgeon: Raynelle Bring, MD;  Location[facility];  Service: Urology;  Laterality: Bilateral;  ? PROSTATE BIOPSY  01/24/2015  ? ROBOT ASSISTED LAPAROSCOPIC RADICAL PROSTATECTOMY N/A 01/24/2015  ? Procedure: ROBOTIC ASSISTED LAPAROSCOPIC RADICAL PROSTATECTOMY LEVEL 3;  Surgeon: Raynelle Bring, MD;  [facility];  Service: Urology;  Laterality: N/A;  ? SALIVARY STONE REMOVAL    ? TEAR DUCT PROBING Left 2022  ? ? ?Current Outpatient Medications  ?Medication Sig Dispense Refill  ? acetaminophen (TYLENOL) 500 MG tablet Take 1,000 mg by mouth in the morning, at noon, and at bedtime.    ? amLODipine (NORVASC) 2.5 MG tablet Take 2.5 mg by mouth daily with lunch.    ? aspirin EC 81 MG tablet Take 81 mg by mouth daily. Swallow whole.    ? Calcium 600-200 MG-UNIT tablet Take 1 tablet by mouth daily.    ? chlorpheniramine (CHLOR-TRIMETON)  4 MG tablet Take 4 mg by mouth 2 (two) times daily as needed for allergies.    ? Cholecalciferol (VITAMIN D) 2000 UNITS CAPS Take 2,000 Units by mouth daily.    ? CINNAMON PO Take 1 capsule by mouth every 7 (seven) days.    ? fluticasone (FLONASE) 50 MCG/ACT nasal spray Place 1 spray into both nostrils daily as needed for allergies or rhinitis.    ? Glucosamine HCl-MSM (GLUCOSAMINE-MSM PO) Take 1 tablet by mouth in the morning and at bedtime.    ? hydrocortisone cream 1 % Apply 1 application topically daily as needed for itching.    ? Magnesium 200 MG TABS Take 200 mg by mouth in the morning and at bedtime.    ? olmesartan (BENICAR) 40 MG tablet Take 40 mg by mouth daily with lunch.    ? Omega-3 Fatty Acids (FISH OIL) 1000 MG CAPS Take 1,000 mg by mouth  in the morning and at bedtime.    ? Probiotic Product (PROBIOTIC PO) Take 1 capsule by mouth daily.    ? Psyllium (METAMUCIL PO) Take 3 scoop by mouth every morning.    ? sildenafil (REVATIO) 20 MG tablet Take 20 mg by mouth daily as needed (erectile dysfunction).    ? TURMERIC PO Take 1 capsule by mouth in the morning and at bedtime.    ? vitamin B-12 (CYANOCOBALAMIN) 500 MCG tablet Take 500 mcg by mouth in the morning and at bedtime.    ? vitamin C (ASCORBIC ACID) 500 MG tablet Take 500 mg by mouth daily.    ? Zinc 30 MG TABS Take 30 mg by mouth every 7 (seven) days.    ? ?No current facility-administered medications for this visit.  ? ? ?Allergies  ?Allergen Reactions  ? Celebrex [Celecoxib] Other (See Comments)  ?  Stomach distress + ulcer.  ? Crestor [Rosuvastatin] Other (See Comments)  ?  Sore muscle. + Pravastatin   ? Diltiazem   ?  Heart pounding, low pulse.   ? Ramipril Other (See Comments)  ?  Heart pounding, sleeplessness  ? Reglan [Metoclopramide] Other (See Comments)  ?  Sleeplessness, Jittery, "felt like crawling out of skin"  ? Losartan Rash  ?  Didn't work   ? Tetracyclines & Related Rash  ? ? ?Social History  ? ?Socioeconomic History  ? Marital status: Married  ?  Spouse name: Not on file  ? Number of children: 3  ? Years of education: Not on file  ? Highest education level: Not on file  ?Occupational History  ? Occupation: Retired-[employer]  ?Tobacco Use  ? Smoking status: Never  ? Smokeless tobacco: Never  ?Vaping Use  ? Vaping Use: Never used  ?Substance and Sexual Activity  ? Alcohol use: No  ? Drug use: No  ? Sexual activity: Not on file  ?Other Topics Concern  ? Not on file  ?Social History Narrative  ? Not on file  ? ?Social Determinants of Health  ? ?Financial Resource Strain: Not on file  ?Food Insecurity: Not on file  ?Transportation Needs: Not on file  ?Physical Activity: Not on file  ?Stress: Not on file  ?Social Connections: Not on file  ?Intimate Partner Violence: Not on file  ? ? ?Family  History  ?Problem Relation Age of Onset  ? Hypertension Mother   ? Stroke Mother   ? Hypertension Father   ? Bladder Cancer Father   ? Diabetes Brother   ? ? ?Review of Systems:  As stated  in the HPI and otherwise negative.  ? ?BP (!) 144/68   Pulse (!) 59   Ht 5' 10" (1.778 m)   Wt 154 lb (69.9 kg)   SpO2 99%   BMI 22.10 kg/m?  ? ?Physical Examination: ?General: Well developed, well nourished, NAD  ?HEENT: OP clear, mucus membranes moist  ?SKIN: warm, dry. No rashes. ?Neuro: No focal deficits  ?Musculoskeletal: Muscle strength 5/5 all ext  ?Psychiatric: Mood and affect normal  ?Neck: No JVD, no carotid bruits, no thyromegaly, no lymphadenopathy.  ?Lungs:Clear bilaterally, no wheezes, rhonci, crackles ?Cardiovascular: Regular rate and rhythm. No murmurs, gallops or rubs. ?Abdomen:Soft. Bowel sounds present. Non-tender.  ?Extremities: No lower extremity edema. Pulses are 2 + in the bilateral DP/PT. ? ?EKG:  EKG is  ordered today. ?The ekg ordered today demonstrates sinus, non-specific T wave abn-unchanged ? ?Echo September 2020:  ? 1. The left ventricle has normal systolic function with an ejection  ?fraction of 60-65%. The cavity size was normal. Left ventricular diastolic  ?Doppler parameters are indeterminate.  ? 2. The right ventricle has normal systolic function. The cavity was  ?mildly enlarged. There is No increase in right ventricular wall thickness.  ? 3. Left atrial size was normal.  ? 4. Right atrial size was normal.  ? 5. Mild mitral valve prolapse.  ? 6. The mitral valve is myxomatous. mild thickening of the mitral valve  ?leaflet. No evidence of mitral valve stenosis.  ? 7. The aortic valve is tricuspid. Mild thickening of the aortic valve.  ?Mild calcification of the aortic valve. Aortic valve regurgitation is mild  ?by color flow Doppler.  ? 8. Pulmonic valve regurgitation is mild by color flow Doppler.  ? 9. The aorta is normal unless otherwise noted.  ? ?Recent Labs: ?09/24/2021: BUN 10;  Creatinine, Ser 0.93; Hemoglobin 13.1; Platelets 276; Potassium 4.2; Sodium 133  ? ?Lipid Panel ?No results found for: CHOL, TRIG, HDL, CHOLHDL, VLDL, LDLCALC, LDLDIRECT ?  ?Wt Readings from Last 3 Encounters:  ?0

## 2022-02-19 ENCOUNTER — Encounter: Payer: Self-pay | Admitting: Cardiovascular Disease

## 2022-02-19 ENCOUNTER — Ambulatory Visit (INDEPENDENT_AMBULATORY_CARE_PROVIDER_SITE_OTHER): Payer: Medicare Other

## 2022-02-19 ENCOUNTER — Ambulatory Visit: Payer: Medicare Other | Admitting: Cardiovascular Disease

## 2022-02-19 VITALS — BP 144/68 | HR 59 | Ht 70.0 in | Wt 154.0 lb

## 2022-02-19 DIAGNOSIS — R42 Dizziness and giddiness: Secondary | ICD-10-CM

## 2022-02-19 DIAGNOSIS — I351 Nonrheumatic aortic (valve) insufficiency: Secondary | ICD-10-CM | POA: Diagnosis not present

## 2022-02-19 DIAGNOSIS — I10 Essential (primary) hypertension: Secondary | ICD-10-CM

## 2022-02-19 NOTE — Progress Notes (Unsigned)
[id number] ZIO XT from office inventory applied to patient. ?

## 2022-02-19 NOTE — Patient Instructions (Addendum)
Medication Instructions:  ?No changes ? ?*If you need a refill on your cardiac medications before your next appointment, please call your pharmacy* ? ?Lab Work: ?none ? ?Testing/Procedures:  PLEASE MOVE ECHO UP TO NEXT AVAILABLE (scheduled in Sept) ? ?Your physician has requested that you have a carotid duplex. This test is an ultrasound of the carotid arteries in your neck. It looks at blood flow through these arteries that supply the brain with blood. Allow one hour for this exam. There are no restrictions or special instructions. ? ?Zio XT Heart Monitor  ? ? ?Follow-Up: ?As planned ? ?ZIO XT- Long Term Monitor Instructions ? ?Your physician has requested you wear a ZIO patch monitor for 14 days.  ?This is a single patch monitor. [employer] supplies one patch monitor per enrollment. Additional ?stickers are not available. Please do not apply patch if you will be having a Nuclear Stress Test,  ?Echocardiogram, Cardiac CT, MRI, or Chest Xray during the period you would be wearing the  ?monitor. The patch cannot be worn during these tests. You cannot remove and re-apply the  ?ZIO XT patch monitor.  ?Your ZIO patch monitor will be mailed 3 day USPS to your address on file. It may take 3-5 days  ?to receive your monitor after you have been enrolled.  ?Once you have received your monitor, please review the enclosed instructions. Your monitor  ?has already been registered assigning a specific monitor serial # to you. ? ?Billing and Patient Assistance Program Information ? ?We have supplied [employer] with any of your insurance information on file for billing purposes. ?[employer] offers a sliding scale Patient Assistance Program for patients that do not have  ?insurance, or whose insurance does not completely cover the cost of the ZIO monitor.  ?You must apply for the Patient Assistance Program to qualify for this discounted rate.  ?To apply, please call [employer] at [phone], select option 4, select option 2, ask to apply for   ?Patient Assistance Program. Theodore Demark will ask your household income, and how many people  ?are in your household. They will quote your out-of-pocket cost based on that information.  ?[employer] will also be able to set up a 67-month interest-free payment plan if needed. ? ?Applying the monitor ?  ?Shave hair from upper left chest.  ?Hold abrader disc by orange tab. Rub abrader in 40 strokes over the upper left chest as  ?indicated in your monitor instructions.  ?Clean area with 4 enclosed alcohol pads. Let dry.  ?Apply patch as indicated in monitor instructions. Patch will be placed under collarbone on left  ?side of chest with arrow pointing upward.  ?Rub patch adhesive wings for 2 minutes. Remove white label marked "1". Remove the white  ?label marked "2". Rub patch adhesive wings for 2 additional minutes.  ?While looking in a mirror, press and release button in center of patch. A small green light will  ?flash 3-4 times. This will be your only indicator that the monitor has been turned on.  ?Do not shower for the first 24 hours. You may shower after the first 24 hours.  ?Press the button if you feel a symptom. You will hear a small click. Record Date, Time and  ?Symptom in the Patient Logbook.  ?When you are ready to remove the patch, follow instructions on the last 2 pages of Patient  ?Logbook. Stick patch monitor onto the last page of Patient Logbook.  ?Place Patient Logbook in the blue and white box. Use locking tab  on box and tape box closed  ?securely. The blue and white box has prepaid postage on it. Please place it in the mailbox as  ?soon as possible. Your physician should have your test results approximately 7 days after the  ?monitor has been mailed back to [facility].  ?Call [facility] at [phone] if you have questions regarding  ?your ZIO XT patch monitor. Call them immediately if you see an orange light blinking on your  ?monitor.  ?If your monitor falls off in less than 4  days, contact our Monitor department at [phone].  ?If your monitor becomes loose or falls off after 4 days call [employer] at [phone] for  ?suggestions on securing your monitor ? ?  ?

## 2022-02-25 DIAGNOSIS — L7621 Postprocedural hemorrhage and hematoma of skin and subcutaneous tissue following a dermatologic procedure: Secondary | ICD-10-CM | POA: Diagnosis not present

## 2022-03-05 DIAGNOSIS — Z1211 Encounter for screening for malignant neoplasm of colon: Secondary | ICD-10-CM | POA: Diagnosis not present

## 2022-03-09 ENCOUNTER — Ambulatory Visit (HOSPITAL_COMMUNITY)
Admission: RE | Admit: 2022-03-09 | Discharge: 2022-03-09 | Disposition: A | Discharge status: Home / Self Care | Payer: Medicare Other | Source: Ambulatory Visit | Attending: Cardiology | Admitting: Cardiology

## 2022-03-09 ENCOUNTER — Other Ambulatory Visit: Payer: Self-pay | Admitting: Cardiovascular Disease

## 2022-03-09 DIAGNOSIS — I10 Essential (primary) hypertension: Secondary | ICD-10-CM | POA: Diagnosis not present

## 2022-03-09 DIAGNOSIS — R42 Dizziness and giddiness: Secondary | ICD-10-CM | POA: Diagnosis not present

## 2022-03-09 DIAGNOSIS — I351 Nonrheumatic aortic (valve) insufficiency: Secondary | ICD-10-CM | POA: Diagnosis not present

## 2022-03-12 ENCOUNTER — Telehealth: Payer: Self-pay | Admitting: Cardiovascular Disease

## 2022-03-12 ENCOUNTER — Ambulatory Visit (HOSPITAL_COMMUNITY): Payer: Medicare Other | Attending: Cardiology

## 2022-03-12 DIAGNOSIS — I10 Essential (primary) hypertension: Secondary | ICD-10-CM | POA: Diagnosis not present

## 2022-03-12 DIAGNOSIS — I340 Nonrheumatic mitral (valve) insufficiency: Secondary | ICD-10-CM | POA: Diagnosis not present

## 2022-03-12 DIAGNOSIS — I351 Nonrheumatic aortic (valve) insufficiency: Secondary | ICD-10-CM

## 2022-03-12 DIAGNOSIS — R001 Bradycardia, unspecified: Secondary | ICD-10-CM

## 2022-03-12 DIAGNOSIS — R42 Dizziness and giddiness: Secondary | ICD-10-CM

## 2022-03-12 LAB — ECHOCARDIOGRAM COMPLETE
Area-P 1/2: 1.95 cm2
P 1/2 time: 438 msec
S' Lateral: 2.6 cm

## 2022-03-12 NOTE — Telephone Encounter (Signed)
Spoke with the patient and made him aware that Dr. Angelena Form has referred him to see an electrophysiologist. Patient verbalized understanding.  ?

## 2022-03-12 NOTE — Addendum Note (Signed)
Addended by: Antonieta Iba on: 03/12/2022 12:48 PM ? ? Modules accepted: Orders ? ?

## 2022-03-12 NOTE — Telephone Encounter (Signed)
Spoke with [place] from [place] calling to report that the patient had a 30 second episode of symptomatic bradycardia with heart rate of 40bpm. This is shown on strip 7. Results are finalized and in the patient's chart for review.  ?

## 2022-03-17 ENCOUNTER — Encounter: Payer: Self-pay | Admitting: Internal Medicine

## 2022-03-17 ENCOUNTER — Ambulatory Visit: Payer: Medicare Other | Admitting: Internal Medicine

## 2022-03-17 VITALS — BP 142/74 | HR 6 | Ht 71.0 in | Wt 150.0 lb

## 2022-03-17 DIAGNOSIS — R001 Bradycardia, unspecified: Secondary | ICD-10-CM

## 2022-03-17 LAB — CBC WITH DIFFERENTIAL/PLATELET
Basophils Absolute: 0.1 10*3/uL (ref 0.0–0.2)
Basos: 1 %
EOS (ABSOLUTE): 0.1 10*3/uL (ref 0.0–0.4)
Eos: 1 %
Hematocrit: 44.3 % (ref 37.5–51.0)
Hemoglobin: 15.1 g/dL (ref 13.0–17.7)
Immature Grans (Abs): 0 10*3/uL (ref 0.0–0.1)
Immature Granulocytes: 0 %
Lymphocytes Absolute: 2 10*3/uL (ref 0.7–3.1)
Lymphs: 34 %
MCH: 30.9 pg (ref 26.6–33.0)
MCHC: 34.1 g/dL (ref 31.5–35.7)
MCV: 91 fL (ref 79–97)
Monocytes Absolute: 0.6 10*3/uL (ref 0.1–0.9)
Monocytes: 10 %
Neutrophils Absolute: 3.2 10*3/uL (ref 1.4–7.0)
Neutrophils: 54 %
Platelets: 325 10*3/uL (ref 150–450)
RBC: 4.88 x10E6/uL (ref 4.14–5.80)
RDW: 12.2 % (ref 11.6–15.4)
WBC: 5.9 10*3/uL (ref 3.4–10.8)

## 2022-03-17 LAB — BASIC METABOLIC PANEL
BUN/Creatinine Ratio: 14 (ref 10–24)
BUN: 13 mg/dL (ref 8–27)
CO2: 27 mmol/L (ref 20–29)
Calcium: 10 mg/dL (ref 8.6–10.2)
Chloride: 94 mmol/L — ABNORMAL LOW (ref 96–106)
Creatinine, Ser: 0.9 mg/dL (ref 0.76–1.27)
Glucose: 100 mg/dL — ABNORMAL HIGH (ref 70–99)
Potassium: 4.6 mmol/L (ref 3.5–5.2)
Sodium: 135 mmol/L (ref 134–144)
eGFR: 89 mL/min/{1.73_m2} (ref >59)

## 2022-03-17 NOTE — Progress Notes (Signed)
? ? ? ? ?HPI ?Kenneth Weaver is referred today by Dr. Angelena Form for evaluation of bradycardia. 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 He has noted weakness and fatigue and HR's in the 40's during the daytime associated with fatigue and weakness. He has not had syncope. He wore a cardiac monitor demonstrating HR's in the high 30's to low 40's due to sinus node dysfunction.  ?Allergies  ?Allergen Reactions  ? Celebrex [Celecoxib] Other (See Comments)  ?  Stomach distress + ulcer.  ? Crestor [Rosuvastatin] Other (See Comments)  ?  Sore muscle. + Pravastatin   ? Diltiazem   ?  Heart pounding, low pulse.   ? Ramipril Other (See Comments)  ?  Heart pounding, sleeplessness  ? Reglan [Metoclopramide] Other (See Comments)  ?  Sleeplessness, Jittery, "felt like crawling out of skin"  ? Losartan Rash  ?  Didn't work   ? Tetracyclines & Related Rash  ? ? ? ?Current Outpatient Medications  ?Medication Sig Dispense Refill  ? acetaminophen (TYLENOL) 500 MG tablet Take 1,000 mg by mouth in the morning, at noon, and at bedtime.    ? amLODipine (NORVASC) 2.5 MG tablet Take 2.5 mg by mouth daily with lunch.    ? Calcium 600-200 MG-UNIT tablet Take 1 tablet by mouth daily.    ? chlorpheniramine (CHLOR-TRIMETON) 4 MG tablet Take 4 mg by mouth 2 (two) times daily as needed for allergies.    ? Cholecalciferol (VITAMIN D) 2000 UNITS CAPS Take 2,000 Units by mouth daily.    ? CINNAMON PO Take 1 capsule by mouth every 7 (seven) days.    ? fluticasone (FLONASE) 50 MCG/ACT nasal spray Place 1 spray into both nostrils daily as needed for allergies or rhinitis.    ? Glucosamine HCl-MSM (GLUCOSAMINE-MSM PO) Take 1 tablet by mouth in the morning and at bedtime.    ? hydrocortisone cream 1 % Apply 1 application topically daily as needed for itching.    ? Magnesium 200 MG TABS Take 200 mg by mouth in the morning and at bedtime.    ? olmesartan (BENICAR) 40 MG tablet Take 40 mg by mouth daily with lunch.    ? Omega-3 Fatty  Acids (FISH OIL) 1000 MG CAPS Take 1,000 mg by mouth in the morning and at bedtime.    ? Probiotic Product (PROBIOTIC PO) Take 1 capsule by mouth daily.    ? Psyllium (METAMUCIL PO) Take 3 scoop by mouth every morning.    ? sildenafil (REVATIO) 20 MG tablet Take 20 mg by mouth daily as needed (erectile dysfunction).    ? TURMERIC PO Take 1 capsule by mouth in the morning and at bedtime.    ? vitamin B-12 (CYANOCOBALAMIN) 500 MCG tablet Take 500 mcg by mouth in the morning and at bedtime.    ? vitamin C (ASCORBIC ACID) 500 MG tablet Take 500 mg by mouth daily.    ? Zinc 30 MG TABS Take 30 mg by mouth every 7 (seven) days.    ? ?No current facility-administered medications for this visit.  ? ? ? ?Past Medical History:  ?Diagnosis Date  ? ALLERGIC RHINITIS 08/09/2008  ? Qualifier: Diagnosis of  By: Doy Mince LPN, Megan    ? 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 Cancer [facility])   ? skin cancer excision right arm 2'16  ? Duodenal ulcer   ? 80's  ? G E R D 08/09/2008  ? Qualifier: Diagnosis of  By:  Reynolds LPN, Barista    ? Heart murmur   ? Hemorrhoids   ? Hx of gastric ulcer   ? HYPERLIPIDEMIA 08/09/2008  ? Qualifier: Diagnosis of  By: Doy Mince LPN, Megan    ? Hypertension   ? HYPERTENSION 08/09/2008  ? Qualifier: Diagnosis of  By: Doy Mince LPN, Megan    ? LOW HDL 08/09/2008  ? Qualifier: Diagnosis of  By: Doy Mince LPN, Megan    ? Mitral valve prolapse   ? Multiple pulmonary nodules 02/12/2015  ? Followed in Pulmonary clinic/ [facility]/ [place]/ never smoker - See CT chest Jan 18 2015 [facility] Multiple tree in bud nodules plus one macrolobulated nodule 1.4 cm LUL  - f/u ov 11/12/2015 no change/ no symtoms > f/u prn    ? Prostate cancer ([place]) 01/24/2015  ? ? ?ROS: ? ? All systems reviewed and negative except as noted in the HPI. ? ? ?Past Surgical History:  ?Procedure Laterality Date  ? CARDIAC CATHETERIZATION    ? '61-[facility] age 77, evaluate heart murmur-dx."MVP"  ? HERNIA REPAIR    ? laparoscopic  ?  LYMPHADENECTOMY Bilateral 01/24/2015  ? Procedure: LYMPHADENECTOMY;  Surgeon: Raynelle Bring, MD;  Location: [facility];  Service: Urology;  Laterality: Bilateral;  ? PROSTATE BIOPSY  01/24/2015  ? ROBOT ASSISTED LAPAROSCOPIC RADICAL PROSTATECTOMY N/A 01/24/2015  ? Procedure: ROBOTIC ASSISTED LAPAROSCOPIC RADICAL PROSTATECTOMY LEVEL 3;  Surgeon: Raynelle Bring, MD;  Location: [facility];  Service: Urology;  Laterality: N/A;  ? SALIVARY STONE REMOVAL    ? TEAR DUCT PROBING Left 2022  ? ? ? ?Family History  ?Problem Relation Age of Onset  ? Hypertension Mother   ? Stroke Mother   ? Hypertension Father   ? Bladder Cancer Father   ? Diabetes Brother   ? ? ? ?Social History  ? ?Socioeconomic History  ? Marital status: Married  ?  Spouse name: Not on file  ? Number of children: 3  ? Years of education: Not on file  ? Highest education level: Not on file  ?Occupational History  ? Occupation: Retired-[employer]  ?Tobacco Use  ? Smoking status: Never  ? Smokeless tobacco: Never  ?Vaping Use  ? Vaping Use: Never used  ?Substance and Sexual Activity  ? Alcohol use: No  ? Drug use: No  ? Sexual activity: Not on file  ?Other Topics Concern  ? Not on file  ?Social History Narrative  ? Not on file  ? ?Social Determinants of Health  ? ?Financial Resource Strain: Not on file  ?Food Insecurity: Not on file  ?Transportation Needs: Not on file  ?Physical Activity: Not on file  ?Stress: Not on file  ?Social Connections: Not on file  ?Intimate Partner Violence: Not on file  ? ? ? ?BP (!) 142/74   Pulse (!) 6   Ht '5\' 11"'$  (1.803 m)   Wt 150 lb (68 kg)   SpO2 98%   BMI 20.92 kg/m?  ? ?Physical Exam: ? ?Well appearing NAD ?HEENT: Unremarkable ?Neck:  No JVD, no thyromegally ?Lymphatics:  No adenopathy ?Back:  No CVA tenderness ?Lungs:  Clear with no wheezes ?HEART:  Regular brady rhythm, no murmurs, no rubs, no clicks ?Abd:  soft, positive bowel sounds, no organomegally, no rebound, no guarding ?Ext:  2 plus pulses, no edema, no cyanosis, no  clubbing ?Skin:  No rashes no nodules ?Neuro:  CN II through XII intact, motor grossly intact ? ?EKG - sinus brady ? ? ?Assess/Plan:  ?Sinus node dysfunction - I discussed  the treatment options with the patient and recommended PM insertion. I have reviewed the indications/risks/benefits/goals/limits and he will call us if he wishes to proceed. ?HTN - his SBP is minimally elevated. Consider adding a beta blocker after his PPM is inserted. 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 I do not appreciate worsening.  ? ?Kenneth Overlie Jalicia Roszak,MD ?

## 2022-03-17 NOTE — Patient Instructions (Addendum)
Medication Instructions:  ?Your physician recommends that you continue on your current medications as directed. Please refer to the Current Medication list given to you today. ? ?Labwork: ?You will get lab work today:  CBC and BMP ? ?Testing/Procedures: ?Your physician has recommended that you have a pacemaker inserted. A pacemaker is a small device that is placed under the skin of your chest or abdomen to help control abnormal heart rhythms. This device uses electrical pulses to prompt the heart to beat at a normal rate. Pacemakers are used to treat heart rhythms that are too slow. Wire (leads) are attached to the pacemaker that goes into the chambers of you heart.  ? ?Follow-Up: ? ?SEE INSTRUCTION LETTER ? ?Any Other Special Instructions Will Be Listed Below (If Applicable). ? ?If you need a refill on your cardiac medications before your next appointment, please call your pharmacy.  ? ?Pacemaker Implantation, Adult ?Pacemaker implantation is a procedure to place a pacemaker inside the chest. A pacemaker is a small computer that sends electrical signals to the heart and helps the heart beat normally. A pacemaker also stores information about heart rhythms. You may need pacemaker implantation if you have: ?A slow heartbeat (bradycardia). ?Loss of consciousness that happens repeatedly (syncope) or repeated episodes of dizziness or light-headedness because of an irregular heart rate. ?Shortness of breath (dyspnea) due to heart problems. ?The pacemaker usually attaches to your heart through a wire called a lead. One or two leads may be needed. There are different types of pacemakers: ?Transvenous pacemaker. This type is placed under the skin or muscle of your upper chest area. The lead goes through a vein in the chest area to reach the inside of the heart. ?Epicardial pacemaker. This type is placed under the skin or muscle of your chest or abdomen. The lead goes through your chest to the outside of the heart. ?Tell a  health care provider about: ?Any allergies you have. ?All medicines you are taking, including vitamins, herbs, eye drops, creams, and over-the-counter medicines. ?Any problems you or family members have had with anesthetic medicines. ?Any blood or bone disorders you have. ?Any surgeries you have had. ?Any medical conditions you have. ?Whether you are pregnant or may be pregnant. ?What are the risks? ?Generally, this is a safe procedure. However, problems may occur, including: ?Infection. ?Bleeding. ?Failure of the pacemaker or the lead. ?Collapse of a lung or bleeding into a lung. ?Blood clot inside a blood vessel with a lead. ?Damage to the heart. ?Infection inside the heart (endocarditis). ?Allergic reactions to medicines. ?What happens before the procedure? ?Staying hydrated ?Follow instructions from your health care provider about hydration, which may include: ?Up to 2 hours before the procedure - you may continue to drink clear liquids, such as water, clear fruit juice, black coffee, and plain tea. ? ?Eating and drinking restrictions ?Follow instructions from your health care provider about eating and drinking, which may include: ?8 hours before the procedure - stop eating heavy meals or foods, such as meat, fried foods, or fatty foods. ?6 hours before the procedure - stop eating light meals or foods, such as toast or cereal. ?6 hours before the procedure - stop drinking milk or drinks that contain milk. ?2 hours before the procedure - stop drinking clear liquids. ?Medicines ?Ask your health care provider about: ?Changing or stopping your regular medicines. This is especially important if you are taking diabetes medicines or blood thinners. ?Taking medicines such as aspirin and ibuprofen. These medicines can thin your  blood. Do not take these medicines unless your health care provider tells you to take them. ?Taking over-the-counter medicines, vitamins, herbs, and supplements. ?Tests ?You may have: ?A heart  evaluation. This may include: ?An electrocardiogram (ECG). This involves placing patches on your skin to check your heart rhythm. ?A chest X-ray. ?An echocardiogram. This is a test that uses sound waves (ultrasound) to produce an image of the heart. ?A cardiac rhythm monitor. This is used to record your heart rhythm and any events for a longer period of time. ?Blood tests. ?Genetic testing. ?General instructions ?Do not use any products that contain nicotine or tobacco for at least 4 weeks before the procedure. These products include cigarettes, e-cigarettes, and chewing tobacco. If you need help quitting, ask your health care provider. ?Ask your health care provider: ?How your surgery site will be marked. ?What steps will be taken to help prevent infection. These steps may include: ?Removing hair at the surgery site. ?Washing skin with a germ-killing soap. ?Receiving antibiotic medicine. ?Plan to have someone take you home from the hospital or clinic. ?If you will be going home right after the procedure, plan to have someone with you for 24 hours. ?What happens during the procedure? ?An IV will be inserted into one of your veins. ?You will be given one or more of the following: ?A medicine to help you relax (sedative). ?A medicine to numb the area (local anesthetic). ?A medicine to make you fall asleep (general anesthetic). ?The next steps vary depending on the type of pacemaker you will be getting. ?If you are getting a transvenous pacemaker: ?An incision will be made in your upper chest. ?A pocket will be made for the pacemaker. It may be placed under the skin or between layers of muscle. ?The lead will be inserted into a blood vessel that goes to the heart. ?While X-rays are taken by an imaging machine (fluoroscopy), the lead will be advanced through the vein to the inside of your heart. ?The other end of the lead will be tunneled under the skin and attached to the pacemaker. ?If you are getting an epicardial  pacemaker: ?An incision will be made near your ribs or breastbone (sternum) for the lead. ?The lead will be attached to the outside of your heart. ?Another incision will be made in your chest or upper abdomen to create a pocket for the pacemaker. ?The free end of the lead will be tunneled under the skin and attached to the pacemaker. ?The transvenous or epicardial pacemaker will be tested. Imaging studies may be done to check the lead position. ?The incisions will be closed with stitches (sutures), adhesive strips, or skin glue. ?Bandages (dressings) will be placed over the incisions. ?The procedure may vary among health care providers and hospitals. ?What happens after the procedure? ?Your blood pressure, heart rate, breathing rate, and blood oxygen level will be monitored until you leave the hospital or clinic. ?You may be given antibiotics. ?You will be given pain medicine. ?An ECG and chest X-rays will be done. ?You may need to wear a continuous type of ECG (Holter monitor) to check your heart rhythm. ?Your health care provider will program the pacemaker. ?If you were given a sedative during the procedure, it can affect you for several hours. Do not drive or operate machinery until your health care provider says that it is safe. ?You will be given a pacemaker identification card. This card lists the implant date, device model, and manufacturer of your pacemaker. ?Summary ?A pacemaker  is a small computer that sends electrical signals to the heart and helps the heart beat normally. ?There are different types of pacemakers. A pacemaker may be placed under the skin or muscle of your chest or abdomen. ?Follow instructions from your health care provider about eating and drinking and about taking medicines before the procedure. ?This information is not intended to replace advice given to you by your health care provider. Make sure you discuss any questions you have with your health care provider. ?Document Revised:  07/22/2021 Document Reviewed: 10/11/2019 ?Elsevier Patient Education ? [place]. ? ? ? ?

## 2022-03-17 NOTE — H&P (View-Only) (Signed)
HPI Kenneth Weaver is referred today by Dr. Angelena Form for evaluation of bradycardia. 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 He has noted weakness and fatigue and HR's in the 40's during the daytime associated with fatigue and weakness. He has not had syncope. He wore a cardiac monitor demonstrating HR's in the high 30's to low 40's due to sinus node dysfunction.  Allergies  Allergen Reactions   Celebrex [Celecoxib] Other (See Comments)    Stomach distress + ulcer.   Crestor [Rosuvastatin] Other (See Comments)    Sore muscle. + Pravastatin    Diltiazem     Heart pounding, low pulse.    Ramipril Other (See Comments)    Heart pounding, sleeplessness   Reglan [Metoclopramide] Other (See Comments)    Sleeplessness, Jittery, "felt like crawling out of skin"   Losartan Rash    Didn't work    Tetracyclines & Related Rash     Current Outpatient Medications  Medication Sig Dispense Refill   acetaminophen (TYLENOL) 500 MG tablet Take 1,000 mg by mouth in the morning, at noon, and at bedtime.     amLODipine (NORVASC) 2.5 MG tablet Take 2.5 mg by mouth daily with lunch.     Calcium 600-200 MG-UNIT tablet Take 1 tablet by mouth daily.     chlorpheniramine (CHLOR-TRIMETON) 4 MG tablet Take 4 mg by mouth 2 (two) times daily as needed for allergies.     Cholecalciferol (VITAMIN D) 2000 UNITS CAPS Take 2,000 Units by mouth daily.     CINNAMON PO Take 1 capsule by mouth every 7 (seven) days.     fluticasone (FLONASE) 50 MCG/ACT nasal spray Place 1 spray into both nostrils daily as needed for allergies or rhinitis.     Glucosamine HCl-MSM (GLUCOSAMINE-MSM PO) Take 1 tablet by mouth in the morning and at bedtime.     hydrocortisone cream 1 % Apply 1 application topically daily as needed for itching.     Magnesium 200 MG TABS Take 200 mg by mouth in the morning and at bedtime.     olmesartan (BENICAR) 40 MG tablet Take 40 mg by mouth daily with lunch.     Omega-3 Fatty  Acids (FISH OIL) 1000 MG CAPS Take 1,000 mg by mouth in the morning and at bedtime.     Probiotic Product (PROBIOTIC PO) Take 1 capsule by mouth daily.     Psyllium (METAMUCIL PO) Take 3 scoop by mouth every morning.     sildenafil (REVATIO) 20 MG tablet Take 20 mg by mouth daily as needed (erectile dysfunction).     TURMERIC PO Take 1 capsule by mouth in the morning and at bedtime.     vitamin B-12 (CYANOCOBALAMIN) 500 MCG tablet Take 500 mcg by mouth in the morning and at bedtime.     vitamin C (ASCORBIC ACID) 500 MG tablet Take 500 mg by mouth daily.     Zinc 30 MG TABS Take 30 mg by mouth every 7 (seven) days.     No current facility-administered medications for this visit.     Past Medical History:  Diagnosis Date   ALLERGIC RHINITIS 08/09/2008   Qualifier: Diagnosis of  By: Doy Mince LPN, Megan     Arthritis    joints, generalized   Cancer ([place])    skin cancer excision right arm 2'16   Duodenal ulcer    80's   G E R D 08/09/2008   Qualifier: Diagnosis of  By:  Reynolds LPN, Megan     Heart murmur    Hemorrhoids    Hx of gastric ulcer    HYPERLIPIDEMIA 08/09/2008   Qualifier: Diagnosis of  By: Doy Mince LPN, Megan     Hypertension    HYPERTENSION 08/09/2008   Qualifier: Diagnosis of  By: Doy Mince LPN, Megan     LOW HDL 08/09/2008   Qualifier: Diagnosis of  By: Doy Mince LPN, Megan     Mitral valve prolapse    Multiple pulmonary nodules 02/12/2015   Followed in Pulmonary clinic/ [facility]/ [place]/ never smoker - See CT chest Jan 18 2015 [facility] Multiple tree in bud nodules plus one macrolobulated nodule 1.4 cm LUL  - f/u ov 11/12/2015 no change/ no symtoms > f/u prn     Prostate cancer ([place]) 01/24/2015    ROS:   All systems reviewed and negative except as noted in the HPI.   Past Surgical History:  Procedure Laterality Date   CARDIAC CATHETERIZATION     '61-[facility] age 63, evaluate heart murmur-dx."MVP"   HERNIA REPAIR     laparoscopic    LYMPHADENECTOMY Bilateral 01/24/2015   Procedure: LYMPHADENECTOMY;  Surgeon: Raynelle Bring, MD;  Location: [facility];  Service: Urology;  Laterality: Bilateral;   PROSTATE BIOPSY  01/24/2015   ROBOT ASSISTED LAPAROSCOPIC RADICAL PROSTATECTOMY N/A 01/24/2015   Procedure: ROBOTIC ASSISTED LAPAROSCOPIC RADICAL PROSTATECTOMY LEVEL 3;  Surgeon: Raynelle Bring, MD;  Location: [facility];  Service: Urology;  Laterality: N/A;   SALIVARY STONE REMOVAL     TEAR DUCT PROBING Left 2022     Family History  Problem Relation Age of Onset   Hypertension Mother    Stroke Mother    Hypertension Father    Bladder Cancer Father    Diabetes Brother      Social History   Socioeconomic History   Marital status: Married    Spouse name: Not on file   Number of children: 3   Years of education: Not on file   Highest education level: Not on file  Occupational History   Occupation: Retired-[employer]  Tobacco Use   Smoking status: Never   Smokeless tobacco: Never  Vaping Use   Vaping Use: Never used  Substance and Sexual Activity   Alcohol use: No   Drug use: No   Sexual activity: Not on file  Other Topics Concern   Not on file  Social History Narrative   Not on file   Social Determinants of Health   Financial Resource Strain: Not on file  Food Insecurity: Not on file  Transportation Needs: Not on file  Physical Activity: Not on file  Stress: Not on file  Social Connections: Not on file  Intimate Partner Violence: Not on file     BP (!) 142/74   Pulse (!) 6   Ht '5\' 11"'$  (1.803 m)   Wt 150 lb (68 kg)   SpO2 98%   BMI 20.92 kg/m   Physical Exam:  Well appearing NAD HEENT: Unremarkable Neck:  No JVD, no thyromegally Lymphatics:  No adenopathy Back:  No CVA tenderness Lungs:  Clear with no wheezes HEART:  Regular brady rhythm, no murmurs, no rubs, no clicks Abd:  soft, positive bowel sounds, no organomegally, no rebound, no guarding Ext:  2 plus pulses, no edema, no cyanosis, no  clubbing Skin:  No rashes no nodules Neuro:  CN II through XII intact, motor grossly intact  EKG - sinus brady   Assess/Plan:  Sinus node dysfunction - I discussed  the treatment options with the patient and recommended PM insertion. I have reviewed the indications/risks/benefits/goals/limits and he will call us if he wishes to proceed. HTN - his SBP is minimally elevated. Consider adding a beta blocker after his PPM is inserted. 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 I do not appreciate worsening.   Carleene Overlie [place] Yeats,MD

## 2022-03-26 ENCOUNTER — Telehealth: Payer: Self-pay | Admitting: Cardiovascular Disease

## 2022-03-26 NOTE — Telephone Encounter (Signed)
Returned call to Pt. ? ?Per Pt he had received a message to call back regarding results.  No specific results were mentioned. ? ?Went over recent testing and all questions answered. ? ?

## 2022-03-26 NOTE — Telephone Encounter (Signed)
? ?  Pt is returning call to get echo result, he ask if someone can call him back before 1 pm because he is going out after 1 ?

## 2022-04-09 ENCOUNTER — Ambulatory Visit (HOSPITAL_COMMUNITY)
Admission: RE | Disposition: A | Discharge status: Home / Self Care | Payer: Self-pay | Source: Home / Self Care | Attending: Internal Medicine

## 2022-04-09 ENCOUNTER — Ambulatory Visit (HOSPITAL_COMMUNITY)
Admission: RE | Admit: 2022-04-09 | Discharge: 2022-04-09 | Disposition: A | Discharge status: Home / Self Care | Payer: Medicare Other | Attending: Internal Medicine | Admitting: Internal Medicine

## 2022-04-09 ENCOUNTER — Ambulatory Visit (HOSPITAL_COMMUNITY): Payer: Medicare Other

## 2022-04-09 ENCOUNTER — Other Ambulatory Visit: Payer: Self-pay

## 2022-04-09 DIAGNOSIS — R001 Bradycardia, unspecified: Secondary | ICD-10-CM

## 2022-04-09 DIAGNOSIS — I351 Nonrheumatic aortic (valve) insufficiency: Secondary | ICD-10-CM | POA: Diagnosis not present

## 2022-04-09 DIAGNOSIS — I10 Essential (primary) hypertension: Secondary | ICD-10-CM | POA: Diagnosis not present

## 2022-04-09 DIAGNOSIS — I495 Sick sinus syndrome: Secondary | ICD-10-CM | POA: Diagnosis not present

## 2022-04-09 DIAGNOSIS — Z950 Presence of cardiac pacemaker: Secondary | ICD-10-CM | POA: Diagnosis not present

## 2022-04-09 HISTORY — PX: PACEMAKER IMPLANT: EP1218

## 2022-04-09 SURGERY — PACEMAKER IMPLANT

## 2022-04-09 MED ORDER — LIDOCAINE HCL (PF) 1 % IJ SOLN
INTRAMUSCULAR | Status: AC
  Filled 2022-04-09: qty 60

## 2022-04-09 MED ORDER — ACETAMINOPHEN 325 MG PO TABS: 325.0000 mg | ORAL_TABLET | ORAL | Status: DC | PRN

## 2022-04-09 MED ORDER — HEPARIN (PORCINE) IN NACL 1000-0.9 UT/500ML-% IV SOLN
INTRAVENOUS | Status: AC
  Filled 2022-04-09: qty 500

## 2022-04-09 MED ORDER — CEFAZOLIN SODIUM-DEXTROSE 2-4 GM/100ML-% IV SOLN
INTRAVENOUS | Status: AC
  Filled 2022-04-09: qty 100

## 2022-04-09 MED ORDER — MIDAZOLAM HCL 5 MG/5ML IJ SOLN
INTRAMUSCULAR | Status: AC
  Filled 2022-04-09: qty 5

## 2022-04-09 MED ORDER — ONDANSETRON HCL 4 MG/2ML IJ SOLN: 4.0000 mg | Freq: Four times a day (QID) | INTRAMUSCULAR | Status: DC | PRN

## 2022-04-09 MED ORDER — CHLORHEXIDINE GLUCONATE 4 % EX LIQD: 4.0000 "application " | Freq: Once | CUTANEOUS | Status: DC

## 2022-04-09 MED ORDER — SODIUM CHLORIDE 0.9 % IV SOLN
INTRAVENOUS | Status: AC
  Filled 2022-04-09: qty 2

## 2022-04-09 MED ORDER — CEFAZOLIN SODIUM-DEXTROSE 2-4 GM/100ML-% IV SOLN
2.0000 g | INTRAVENOUS | Status: AC
  Administered 2022-04-09: 2 g via INTRAVENOUS

## 2022-04-09 MED ORDER — FENTANYL CITRATE (PF) 100 MCG/2ML IJ SOLN
INTRAMUSCULAR | Status: DC | PRN
  Administered 2022-04-09: 25 ug via INTRAVENOUS
  Administered 2022-04-09 (×2): 12.5 ug via INTRAVENOUS

## 2022-04-09 MED ORDER — POVIDONE-IODINE 10 % EX SWAB: 2.0000 "application " | Freq: Once | CUTANEOUS | Status: DC

## 2022-04-09 MED ORDER — LIDOCAINE HCL (PF) 1 % IJ SOLN
INTRAMUSCULAR | Status: DC | PRN
  Administered 2022-04-09: 60 mL

## 2022-04-09 MED ORDER — HEPARIN (PORCINE) IN NACL 1000-0.9 UT/500ML-% IV SOLN
INTRAVENOUS | Status: DC | PRN
  Administered 2022-04-09: 500 mL

## 2022-04-09 MED ORDER — FENTANYL CITRATE (PF) 100 MCG/2ML IJ SOLN
INTRAMUSCULAR | Status: AC
  Filled 2022-04-09: qty 2

## 2022-04-09 MED ORDER — MIDAZOLAM HCL 5 MG/5ML IJ SOLN
INTRAMUSCULAR | Status: DC | PRN
  Administered 2022-04-09: 2 mg via INTRAVENOUS
  Administered 2022-04-09 (×2): 1 mg via INTRAVENOUS

## 2022-04-09 MED ORDER — CEFAZOLIN SODIUM-DEXTROSE 1-4 GM/50ML-% IV SOLN
1.0000 g | Freq: Once | INTRAVENOUS | Status: AC
  Administered 2022-04-09: 1 g via INTRAVENOUS
  Filled 2022-04-09: qty 50

## 2022-04-09 MED ORDER — SODIUM CHLORIDE 0.9 % IV SOLN
80.0000 mg | INTRAVENOUS | Status: AC
  Administered 2022-04-09: 80 mg

## 2022-04-09 MED ORDER — SODIUM CHLORIDE 0.9 % IV SOLN: INTRAVENOUS | Status: DC

## 2022-04-09 SURGICAL SUPPLY — 11 items
CABLE SURGICAL S-101-97-12 (CABLE) ×2 IMPLANT
KIT ACCESSORY SELECTRA FIX CVD (MISCELLANEOUS) ×1 IMPLANT
LEAD SELECTRA 3D-55-42 (CATHETERS) ×1 IMPLANT
LEAD SELECTRA 3D-65-42 (CATHETERS) ×1 IMPLANT
LEAD SOLIA S PRO MRI 53 (Lead) ×1 IMPLANT
LEAD SOLIA S PRO MRI 60 (Lead) ×2 IMPLANT
PACEMAKER EDORA 8DR-T MRI (Pacemaker) ×1 IMPLANT
PAD DEFIB RADIO PHYSIO CONN (PAD) ×2 IMPLANT
SHEATH 7FR PRELUDE SNAP 13 (SHEATH) ×1 IMPLANT
SHEATH 9FR PRELUDE SNAP 13 (SHEATH) ×1 IMPLANT
TRAY PACEMAKER INSERTION (PACKS) ×2 IMPLANT

## 2022-04-09 NOTE — Progress Notes (Signed)
Dr Lovena Le notified of post CXR-orders to d/c pt home

## 2022-04-09 NOTE — Discharge Instructions (Signed)
After Your Pacemaker   You have a Biotronik Pacemaker  ACTIVITY Do not lift your arm above shoulder height for 1 week after your procedure. After 7 days, you may progress as below.  You should remove your sling 24 hours after your procedure, unless otherwise instructed by your provider.     Thursday Apr 16, 2022  Friday Apr 17, 2022 Saturday Apr 18, 2022 Sunday Apr 19, 2022   Do not lift, push, pull, or carry anything over 10 pounds with the affected arm until 6 weeks (Thursday May 21, 2022 ) after your procedure.   You may drive AFTER your wound check, unless you have been told otherwise by your provider.   Ask your healthcare provider when you can go back to work   INCISION/Dressing If you are on a blood thinner such as Coumadin, Xarelto, Eliquis, Plavix, or Pradaxa please confirm with your provider when this should be resumed.   If large square, outer bandage is left in place, this can be removed after 24 hours from your procedure. Do not remove steri-strips or glue as below.   Monitor your Pacemaker site for redness, swelling, and drainage. Call the device clinic at [phone] if you experience these symptoms or fever/chills.  If your incision is sealed with Steri-strips or staples, you may shower 7 days after your procedure or when told by your provider. Do not remove the steri-strips or let the shower hit directly on your site. You may wash around your site with soap and water.    If you were discharged in a sling, please do not wear this during the day more than 48 hours after your surgery unless otherwise instructed. This may increase the risk of stiffness and soreness in your shoulder.   Avoid lotions, ointments, or perfumes over your incision until it is well-healed.  You may use a hot tub or a pool AFTER your wound check appointment if the incision is completely closed.  Pacemaker Alerts:  Some alerts are vibratory and others beep. These are NOT emergencies. Please  call our office to let us know. If this occurs at night or on weekends, it can wait until the next business day. Send a remote transmission.  If your device is capable of reading fluid status (for heart failure), you will be offered monthly monitoring to review this with you.   DEVICE MANAGEMENT Remote monitoring is used to monitor your pacemaker from home. This monitoring is scheduled every 91 days by our office. It allows [place] to keep an eye on the functioning of your device to ensure it is working properly. You will routinely see your Electrophysiologist annually (more often if necessary).   You should receive your ID card for your new device in 4-8 weeks. Keep this card with you at all times once received. Consider wearing a medical alert bracelet or necklace.  Your Pacemaker may be MRI compatible. This will be discussed at your next office visit/wound check.  You should avoid contact with strong electric or magnetic fields.   Do not use amateur (ham) radio equipment or electric (arc) welding torches. MP3 player headphones with magnets should not be used. Some devices are safe to use if held at least 12 inches (30 cm) from your Pacemaker. These include power tools, lawn mowers, and speakers. If you are unsure if something is safe to use, ask your health care provider.  When using your cell phone, hold it to the ear that is on the opposite side from the  Pacemaker. Do not leave your cell phone in a pocket over the Pacemaker.  You may safely use electric blankets, heating pads, computers, and microwave ovens.  Call the office right away if: You have chest pain. You feel more short of breath than you have felt before. You feel more light-headed than you have felt before. Your incision starts to open up.  This information is not intended to replace advice given to you by your health care provider. Make sure you discuss any questions you have with your health care provider.

## 2022-04-09 NOTE — Interval H&P Note (Signed)
History and Physical Interval Note:  04/09/2022 9:24 AM  Kenneth Weaver  has presented today for surgery, with the diagnosis of bradicardia.  The various methods of treatment have been discussed with the patient and family. After consideration of risks, benefits and other options for treatment, the patient has consented to  Procedure(s): PACEMAKER IMPLANT (N/A) as a surgical intervention.  The patient's history has been reviewed, patient examined, no change in status, stable for surgery.  I have reviewed the patient's chart and labs.  Questions were answered to the patient's satisfaction.     Cristopher Peru

## 2022-04-10 ENCOUNTER — Encounter (HOSPITAL_COMMUNITY): Payer: Self-pay | Admitting: Internal Medicine

## 2022-04-10 ENCOUNTER — Telehealth: Payer: Self-pay

## 2022-04-10 MED FILL — Gentamicin Sulfate Inj 40 MG/ML: INTRAMUSCULAR | Qty: 80 | Status: AC

## 2022-04-10 NOTE — Telephone Encounter (Signed)
Follow-up after same day discharge: Implant date: 04/09/2022 MD: Cristopher Peru, MD Device: Biotronik [id number] [place] 8 DR-T Location: Left Chest   Wound check visit: 04/22/2022 @ 11:20 AM 90 day MD follow-up: 07/13/2022 @ 3:00 PM  Remote Transmission received:Yes  Dressing/sling removed: Yes  Confirm OAC restart on: N/A

## 2022-04-10 NOTE — Telephone Encounter (Signed)
-----   Message from Shirley Friar, PA-C sent at 04/09/2022  2:57 PM EDT ----- GT same day PPM 5/18

## 2022-04-22 ENCOUNTER — Ambulatory Visit (INDEPENDENT_AMBULATORY_CARE_PROVIDER_SITE_OTHER): Payer: Medicare Other

## 2022-04-22 DIAGNOSIS — R001 Bradycardia, unspecified: Secondary | ICD-10-CM

## 2022-04-22 LAB — CUP PACEART INCLINIC DEVICE CHECK
Date Time Interrogation Session: 20230531112820
Implantable Lead Implant Date: 20230518
Implantable Lead Implant Date: 20230518
Implantable Lead Location: 753859
Implantable Lead Location: 753860
Implantable Lead Model: 377171
Implantable Lead Model: 377171
Implantable Lead Serial Number: 8000862722
Implantable Lead Serial Number: 8000872742
Implantable Pulse Generator Implant Date: 20230518
Pulse Gen Model: 407145
Pulse Gen Serial Number: 70417017

## 2022-04-22 NOTE — Progress Notes (Signed)
Wound check appointment. Steri-strips removed. Wound without redness or edema. Incision edges approximated, wound well healed. Normal device function. Thresholds, sensing, and impedances consistent with implant measurements. Device programmed at 3.0V/auto capture programmed on for extra safety margin until 3 month visit. Histogram distribution appropriate for patient and level of activity. No mode switches or high ventricular rates noted. Patient educated about wound care, arm mobility, lifting restrictions. ROV in 3 months with implanting physician.

## 2022-04-22 NOTE — Patient Instructions (Signed)

## 2022-04-23 DIAGNOSIS — Z85828 Personal history of other malignant neoplasm of skin: Secondary | ICD-10-CM | POA: Diagnosis not present

## 2022-04-23 DIAGNOSIS — D692 Other nonthrombocytopenic purpura: Secondary | ICD-10-CM | POA: Diagnosis not present

## 2022-04-23 DIAGNOSIS — D0439 Carcinoma in situ of skin of other parts of face: Secondary | ICD-10-CM | POA: Diagnosis not present

## 2022-04-23 DIAGNOSIS — D225 Melanocytic nevi of trunk: Secondary | ICD-10-CM | POA: Diagnosis not present

## 2022-04-23 DIAGNOSIS — L821 Other seborrheic keratosis: Secondary | ICD-10-CM | POA: Diagnosis not present

## 2022-04-24 DIAGNOSIS — Z8546 Personal history of malignant neoplasm of prostate: Secondary | ICD-10-CM | POA: Diagnosis not present

## 2022-05-01 DIAGNOSIS — Z8546 Personal history of malignant neoplasm of prostate: Secondary | ICD-10-CM | POA: Diagnosis not present

## 2022-05-01 DIAGNOSIS — N5201 Erectile dysfunction due to arterial insufficiency: Secondary | ICD-10-CM | POA: Diagnosis not present

## 2022-05-08 DIAGNOSIS — R195 Other fecal abnormalities: Secondary | ICD-10-CM | POA: Diagnosis not present

## 2022-05-08 DIAGNOSIS — Z950 Presence of cardiac pacemaker: Secondary | ICD-10-CM | POA: Diagnosis not present

## 2022-05-11 ENCOUNTER — Telehealth: Payer: Self-pay | Admitting: *Deleted

## 2022-05-11 NOTE — Telephone Encounter (Signed)
   Pre-operative Risk Assessment    Patient Name: Kenneth Weaver  DOB: 1946/03/04 MRN: [id number]      Request for Surgical Clearance    Procedure:   ENDOSCOPY/COLONOSCOPY  +FIT TEST  Date of Surgery:  Clearance 07/07/22                                 Surgeon:  DR. PARAG BRAHMBHATT Surgeon's Group or Practice Name:  [facility] Phone number:  [phone] Fax number:  [phone]   Type of Clearance Requested:   - Medical ; ASA    Type of Anesthesia:   PROPOFOL   Additional requests/questions:    [name]   05/11/2022, 8:42 AM

## 2022-05-11 NOTE — Telephone Encounter (Signed)
   Primary Cardiologist: Lauree Chandler, MD  Chart reviewed as part of pre-operative protocol coverage. Given past medical history and time since last visit, based on ACC/AHA guidelines, Kenneth Weaver would be at acceptable risk for the planned procedure without further cardiovascular testing.   His aspirin is not prescribed by cardiology.  Recommendations for holding aspirin will need to come from prescribing provider.  I will route this recommendation to the requesting party via Epic fax function and remove from pre-op pool.  Please call with questions.  Jossie Ng. Ashok Sawaya NP-C    05/11/2022, 10:24 AM [name] Office [phone] Fax [phone]

## 2022-06-02 DIAGNOSIS — D0430 Carcinoma in situ of skin of unspecified part of face: Secondary | ICD-10-CM | POA: Diagnosis not present

## 2022-06-02 DIAGNOSIS — L821 Other seborrheic keratosis: Secondary | ICD-10-CM | POA: Diagnosis not present

## 2022-07-09 ENCOUNTER — Ambulatory Visit (INDEPENDENT_AMBULATORY_CARE_PROVIDER_SITE_OTHER): Payer: Medicare Other

## 2022-07-09 DIAGNOSIS — I495 Sick sinus syndrome: Secondary | ICD-10-CM | POA: Diagnosis not present

## 2022-07-09 LAB — CUP PACEART REMOTE DEVICE CHECK
Date Time Interrogation Session: 20230816072741
Implantable Lead Implant Date: 20230518
Implantable Lead Implant Date: 20230518
Implantable Lead Location: 753859
Implantable Lead Location: 753860
Implantable Lead Model: 377171
Implantable Lead Model: 377171
Implantable Lead Serial Number: 8000862722
Implantable Lead Serial Number: 8000872742
Implantable Pulse Generator Implant Date: 20230518
Lead Channel Setting Pacing Amplitude: 3 V
Lead Channel Setting Pacing Amplitude: 3 V
Lead Channel Setting Pacing Pulse Width: 0.4 ms
Pulse Gen Model: 407145
Pulse Gen Serial Number: 70417017

## 2022-07-13 ENCOUNTER — Ambulatory Visit: Payer: Medicare Other | Admitting: Internal Medicine

## 2022-07-13 ENCOUNTER — Encounter: Payer: Self-pay | Admitting: Internal Medicine

## 2022-07-13 DIAGNOSIS — Z950 Presence of cardiac pacemaker: Secondary | ICD-10-CM | POA: Insufficient documentation

## 2022-07-13 DIAGNOSIS — I10 Essential (primary) hypertension: Secondary | ICD-10-CM | POA: Insufficient documentation

## 2022-07-13 DIAGNOSIS — I495 Sick sinus syndrome: Secondary | ICD-10-CM | POA: Diagnosis not present

## 2022-07-13 NOTE — Progress Notes (Signed)
HPI [name] returns today for followup. 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 He feels well. He had no problems with PPM implant. Denies chest pain, sob, or incisional pain. He remains active. He is moving down to [place] to be closer to his children and grandchildren and seeks referral for another cardiologist for PM followup.  Allergies  Allergen Reactions   Celebrex [Celecoxib] Other (See Comments)    Stomach distress + ulcer.   Crestor [Rosuvastatin] Other (See Comments)    Sore muscle. + Pravastatin    Diltiazem     Heart pounding, low pulse.    Ramipril Other (See Comments)    Heart pounding, sleeplessness   Reglan [Metoclopramide] Other (See Comments)    Sleeplessness, Jittery, "felt like crawling out of skin"   Losartan Rash    Didn't work    Tetracyclines & Related Rash     Current Outpatient Medications  Medication Sig Dispense Refill   acetaminophen (TYLENOL) 500 MG tablet Take 1,000 mg by mouth in the morning, at noon, and at bedtime.     amLODipine (NORVASC) 2.5 MG tablet Take 2.5 mg by mouth daily with lunch.     aspirin EC 81 MG tablet Take 81 mg by mouth daily. Swallow whole.     Calcium 600-200 MG-UNIT tablet Take 1 tablet by mouth daily.     chlorpheniramine (CHLOR-TRIMETON) 4 MG tablet Take 4 mg by mouth 2 (two) times daily as needed for allergies.     Cholecalciferol (VITAMIN D) 2000 UNITS CAPS Take 2,000 Units by mouth daily.     CINNAMON PO Take 1 capsule by mouth every 7 (seven) days.     fluticasone (FLONASE) 50 MCG/ACT nasal spray Place 1 spray into both nostrils daily as needed for allergies or rhinitis.     Glucosamine HCl-MSM (GLUCOSAMINE-MSM PO) Take 1 tablet by mouth in the morning and at bedtime.     hydrocortisone cream 1 % Apply 1 application topically daily as needed for itching.     Magnesium 200 MG TABS Take 200 mg by mouth in the morning and at bedtime.     olmesartan  (BENICAR) 40 MG tablet Take 40 mg by mouth daily with lunch.     Omega-3 Fatty Acids (FISH OIL) 1000 MG CAPS Take 1,000 mg by mouth in the morning and at bedtime.     Probiotic Product (PROBIOTIC PO) Take 1 capsule by mouth daily.     Psyllium (METAMUCIL PO) Take 3 scoop by mouth every morning.     sildenafil (REVATIO) 20 MG tablet Take 20 mg by mouth daily as needed (erectile dysfunction).     TURMERIC PO Take 1 capsule by mouth in the morning and at bedtime.     vitamin B-12 (CYANOCOBALAMIN) 500 MCG tablet Take 500 mcg by mouth in the morning and at bedtime.     Zinc 30 MG TABS Take 30 mg by mouth every 7 (seven) days.     No current facility-administered medications for this visit.     Past Medical History:  Diagnosis Date   ALLERGIC RHINITIS 08/09/2008   Qualifier: Diagnosis of  By: Doy Mince LPN, Megan     Arthritis    joints, generalized   Cancer ([place])    skin cancer excision right arm 2'16   Duodenal ulcer    80's   G E R D 08/09/2008   Qualifier: Diagnosis of  By: Doy Mince  LPN, Megan     Heart murmur    Hemorrhoids    Hx of gastric ulcer    HYPERLIPIDEMIA 08/09/2008   Qualifier: Diagnosis of  By: Doy Mince LPN, Megan     Hypertension    HYPERTENSION 08/09/2008   Qualifier: Diagnosis of  By: Doy Mince LPN, Megan     LOW HDL 08/09/2008   Qualifier: Diagnosis of  By: Doy Mince LPN, Megan     Mitral valve prolapse    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   Past Surgical History:  Procedure Laterality Date   CARDIAC CATHETERIZATION     '61-[facility] age 1, evaluate heart murmur-dx."MVP"   HERNIA REPAIR     laparoscopic    LYMPHADENECTOMY Bilateral 01/24/2015   Procedure: LYMPHADENECTOMY;  Surgeon: Raynelle Bring, MD;  Location: [facility];  Service: Urology;  Laterality: Bilateral;   PACEMAKER IMPLANT N/A 04/09/2022   Procedure: PACEMAKER IMPLANT;  Surgeon: Evans Lance, MD;  Location: [facility];  Service: Cardiovascular;  Laterality: N/A;   PROSTATE BIOPSY  01/24/2015   ROBOT ASSISTED LAPAROSCOPIC RADICAL PROSTATECTOMY N/A 01/24/2015   Procedure: ROBOTIC ASSISTED LAPAROSCOPIC RADICAL PROSTATECTOMY LEVEL 3;  Surgeon: Raynelle Bring, MD;  Location: [facility];  Service: Urology;  Laterality: N/A;   SALIVARY STONE REMOVAL     TEAR DUCT PROBING Left 2022     Family History  Problem Relation Age of Onset   Hypertension Mother    Stroke Mother    Hypertension Father    Bladder Cancer Father    Diabetes Brother      Social History   Socioeconomic History   Marital status: Married    Spouse name: Not on file   Number of children: 3   Years of education: Not on file   Highest education level: Not on file  Occupational History   Occupation: Retired-[employer]  Tobacco Use   Smoking status: Never   Smokeless tobacco: Never  Vaping Use   Vaping Use: Never used  Substance and Sexual Activity   Alcohol use: No   Drug use: No   Sexual activity: Not on file  Other Topics Concern   Not on file  Social History Narrative   Not on file   Social Determinants of Health   Financial Resource Strain: Not on file  Food Insecurity: Not on file  Transportation Needs: Not on file  Physical Activity: Not on file  Stress: Not on file  Social Connections: Not on file  Intimate Partner Violence: Not on file     BP 134/76   Pulse 71   Ht '5\' 11"'$  (1.803 m)   Wt 150 lb (68 kg)   SpO2 97%   BMI 20.92 kg/m   Physical Exam:  Well appearing NAD HEENT: Unremarkable Neck:  No JVD, no thyromegally Lymphatics:  No adenopathy Back:  No CVA tenderness Lungs:  Clear with no wheezes HEART:  Regular rate rhythm,  very soft AI murmur, no rubs, no clicks Abd:  soft, positive bowel sounds, no organomegally, no rebound, no guarding Ext:  2 plus pulses, no edema, no cyanosis, no clubbing Skin:  No  rashes no nodules Neuro:  CN II through XII intact, motor grossly intact  EKG - nsr with atrial pacing  DEVICE  Normal device function.  See PaceArt for details.   Assess/Plan:  Sinus node dysfunction - he is s/p PPM insertion and is doing well. I'll see him back as needed in that he is moving to [place]. I will try to arrange followup with Dr. Shawnie Dapper.  PPM - his biotronik DDD PPM is working normally. We turned down his outputs today to maximize battery longevity. AI - by echo and exam mild. No increase in pulse pressure. He will need an echo in a year or so.  Kenneth Weaver Kenneth Dalesandro,MD

## 2022-07-13 NOTE — Patient Instructions (Addendum)
Medication Instructions:  Your physician recommends that you continue on your current medications as directed. Please refer to the Current Medication list given to you today.  *If you need a refill on your cardiac medications before your next appointment, please call your pharmacy*  NO CHANGES WITH YOUR MEDICATIONS  Lab Work: None ordered.  If you have labs (blood work) drawn today and your tests are completely normal, you will receive your results only by: [place] (if you have MyChart) OR A paper copy in the mail If you have any lab test that is abnormal or we need to change your treatment, we will call you to review the results.  Testing/Procedures: None ordered.  Follow-Up:  YOU WILL FOLLOW UP WITH DR. Gwen Her. CHRISTOPHER ( [facility] ) IN [place], [place].   ADDRESS:    [address], [place], [place] [address] Phone: [phone]  Remote monitoring is used to monitor your Pacemaker from home. This monitoring reduces the number of office visits required to check your device to one time per year. It allows [place] to keep an eye on the functioning of your device to ensure it is working properly. You are scheduled for a device check from home on 10/08/2022. You may send your transmission at any time that day. If you have a wireless device, the transmission will be sent automatically. After your physician reviews your transmission, you will receive a postcard with your next transmission date.  Important Information About Sugar

## 2022-07-21 DIAGNOSIS — H5203 Hypermetropia, bilateral: Secondary | ICD-10-CM | POA: Diagnosis not present

## 2022-07-24 ENCOUNTER — Other Ambulatory Visit (HOSPITAL_COMMUNITY): Payer: Medicare Other

## 2022-08-04 NOTE — Progress Notes (Signed)
Remote pacemaker transmission.   

## 2022-08-14 DIAGNOSIS — R7303 Prediabetes: Secondary | ICD-10-CM | POA: Diagnosis not present

## 2022-08-14 DIAGNOSIS — E875 Hyperkalemia: Secondary | ICD-10-CM | POA: Diagnosis not present

## 2022-08-14 DIAGNOSIS — I10 Essential (primary) hypertension: Secondary | ICD-10-CM | POA: Diagnosis not present

## 2022-08-14 DIAGNOSIS — E7800 Pure hypercholesterolemia, unspecified: Secondary | ICD-10-CM | POA: Diagnosis not present

## 2022-08-22 IMAGING — DX DG CHEST 2V
2 series · 2 of 2 positions shown · non-contrast
Comparison: 11/12/2015

CLINICAL DATA: Pacemaker placement

EXAM:
CHEST - 2 VIEW

[w chest pa]
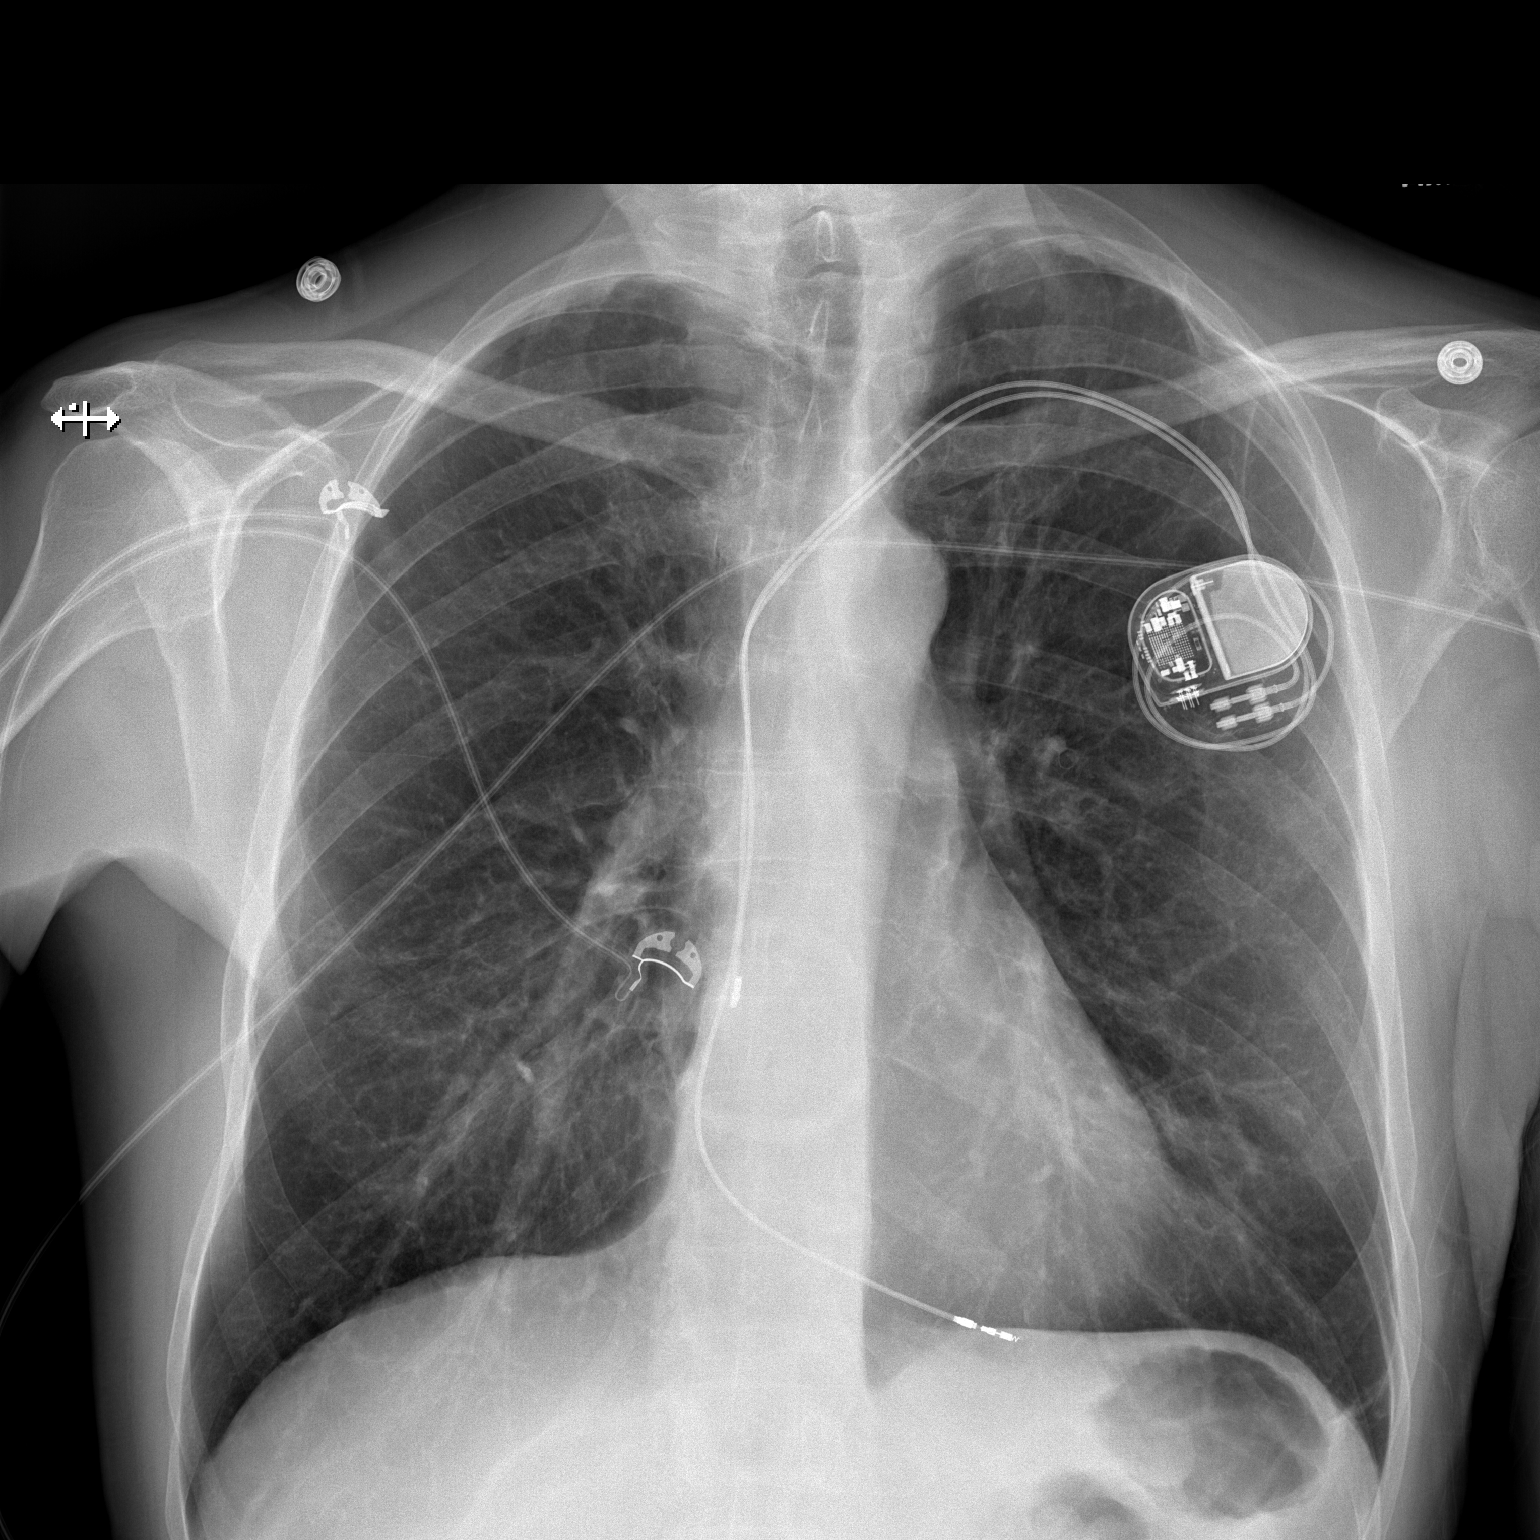

[w chest lat]
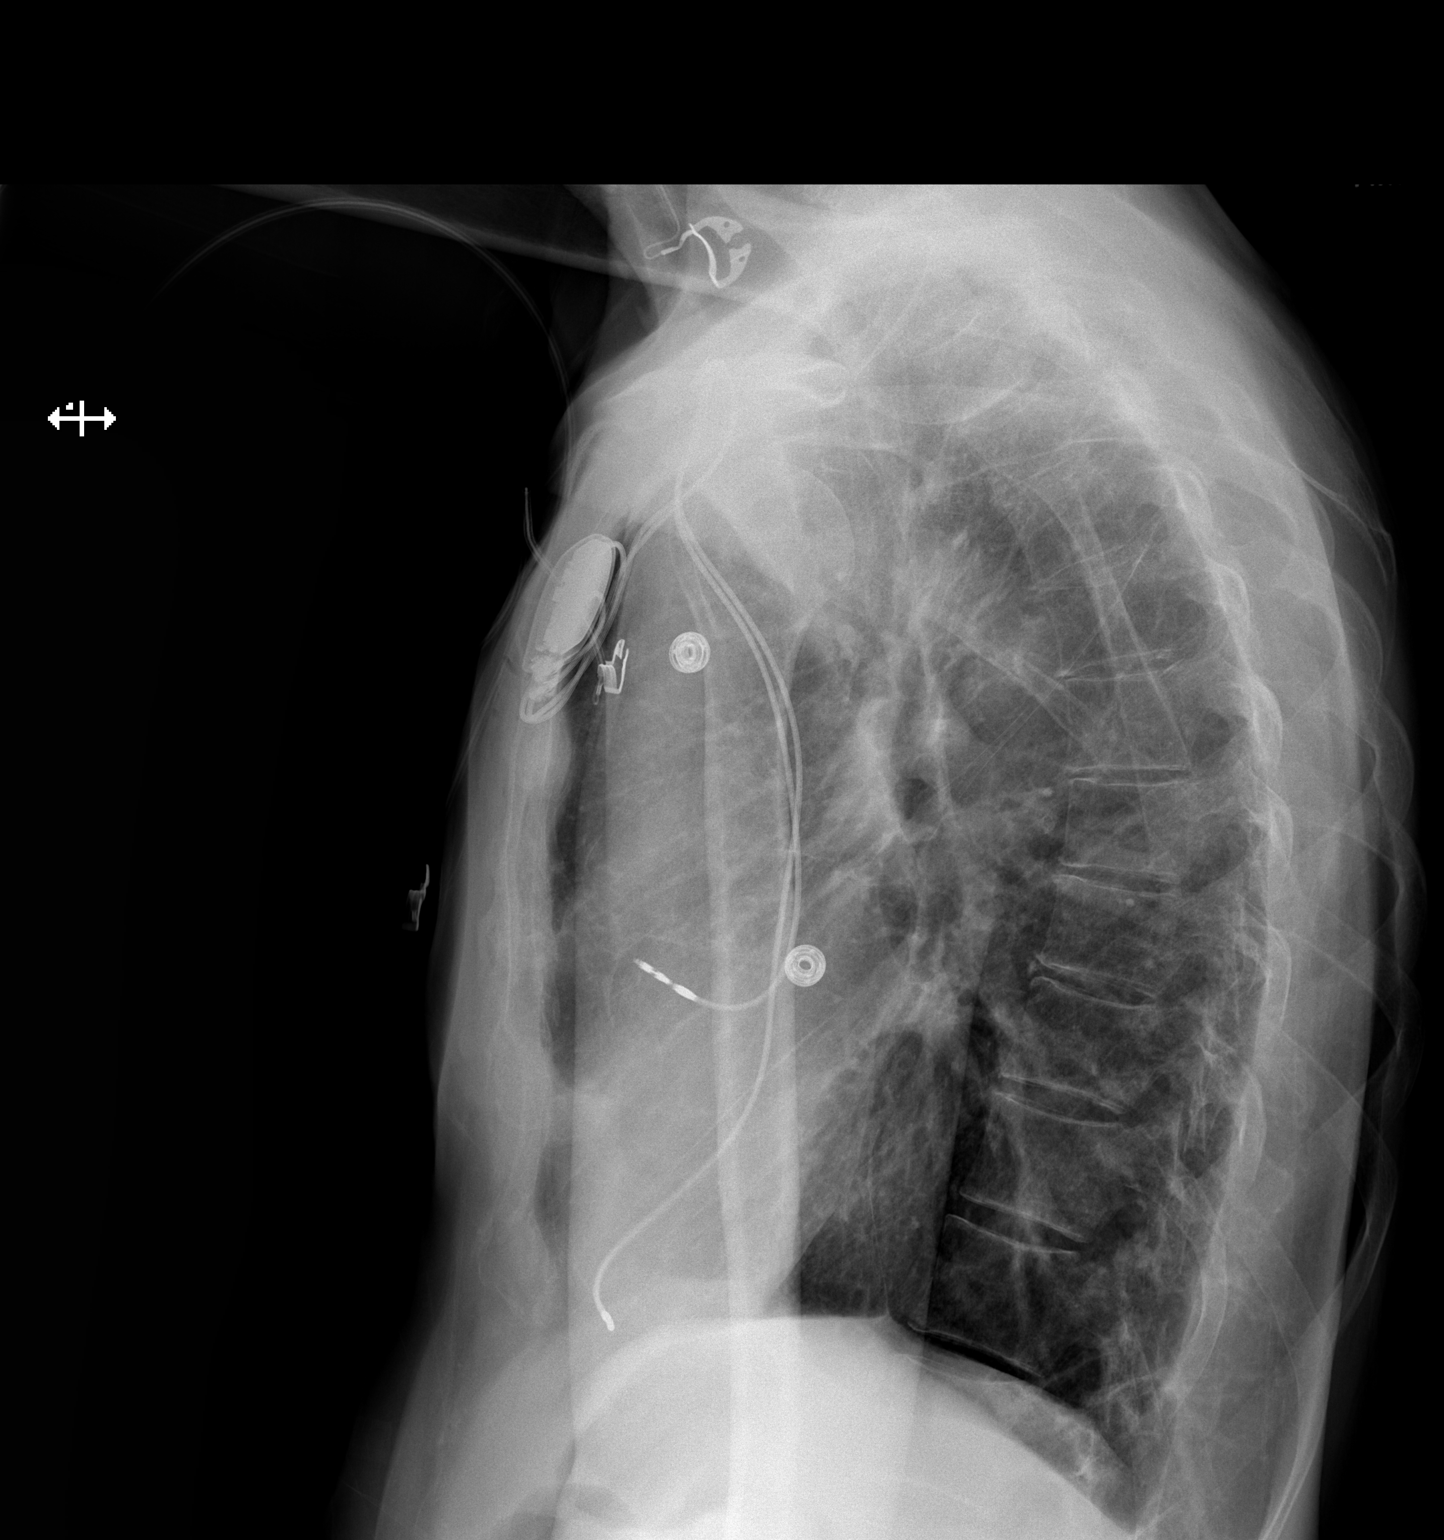

[2 of 2 positions shown; findings below may reference images not displayed]

FINDINGS: Frontal and lateral views of the chest demonstrate interval
placement of a dual lead pacer, proximal lead in the region of the
right atrium and distal lead in the region of the right ventricle.
The cardiac silhouette is unremarkable. No airspace disease,
effusion, or pneumothorax. There are no acute bony abnormalities.
IMPRESSION: 1. No complication after pacemaker placement.
2. No acute intrathoracic process.

## 2022-08-24 ENCOUNTER — Telehealth: Payer: Self-pay | Admitting: Internal Medicine

## 2022-08-24 NOTE — Telephone Encounter (Signed)
Caller stated the patient was referred to them but they did not receive all of the information for this patient and would like the referral re-faxed to include the demographics, to fax# [phone].

## 2022-08-25 NOTE — Telephone Encounter (Signed)
Faxed referral to [place], Dr. Gwen Her. Kenneth Weaver's office at [address], [address], [place] [address]; Per Dr. Carleene Overlie Taylor's orders.     Phone [phone] Fax [phone].    Fax sent at 945 am on 08/25/2022;

## 2022-10-08 ENCOUNTER — Ambulatory Visit (INDEPENDENT_AMBULATORY_CARE_PROVIDER_SITE_OTHER): Payer: Medicare Other

## 2022-10-08 DIAGNOSIS — I495 Sick sinus syndrome: Secondary | ICD-10-CM

## 2022-10-08 LAB — CUP PACEART REMOTE DEVICE CHECK
Date Time Interrogation Session: 20231116080437
Implantable Lead Connection Status: 753985
Implantable Lead Connection Status: 753985
Implantable Lead Implant Date: 20230518
Implantable Lead Implant Date: 20230518
Implantable Lead Location: 753859
Implantable Lead Location: 753860
Implantable Lead Model: 377171
Implantable Lead Model: 377171
Implantable Lead Serial Number: 8000862722
Implantable Lead Serial Number: 8000872742
Implantable Pulse Generator Implant Date: 20230518
Pulse Gen Model: 407145
Pulse Gen Serial Number: 70417017

## 2022-10-21 DIAGNOSIS — R0981 Nasal congestion: Secondary | ICD-10-CM | POA: Diagnosis not present

## 2022-10-21 DIAGNOSIS — J019 Acute sinusitis, unspecified: Secondary | ICD-10-CM | POA: Diagnosis not present

## 2022-10-28 NOTE — Progress Notes (Signed)
Remote pacemaker transmission.   

## 2022-11-25 DIAGNOSIS — M1990 Unspecified osteoarthritis, unspecified site: Secondary | ICD-10-CM | POA: Diagnosis not present

## 2022-11-25 DIAGNOSIS — Z950 Presence of cardiac pacemaker: Secondary | ICD-10-CM | POA: Diagnosis not present

## 2022-11-25 DIAGNOSIS — I10 Essential (primary) hypertension: Secondary | ICD-10-CM | POA: Diagnosis not present

## 2022-11-25 DIAGNOSIS — Z7689 Persons encountering health services in other specified circumstances: Secondary | ICD-10-CM | POA: Diagnosis not present

## 2022-11-25 DIAGNOSIS — E559 Vitamin D deficiency, unspecified: Secondary | ICD-10-CM | POA: Diagnosis not present

## 2023-01-05 DIAGNOSIS — Z86010 Personal history of colonic polyps: Secondary | ICD-10-CM | POA: Diagnosis not present

## 2023-01-05 DIAGNOSIS — R195 Other fecal abnormalities: Secondary | ICD-10-CM | POA: Diagnosis not present

## 2023-01-07 ENCOUNTER — Ambulatory Visit (INDEPENDENT_AMBULATORY_CARE_PROVIDER_SITE_OTHER): Payer: Medicare Other

## 2023-01-07 DIAGNOSIS — I495 Sick sinus syndrome: Secondary | ICD-10-CM | POA: Diagnosis not present

## 2023-01-08 LAB — CUP PACEART REMOTE DEVICE CHECK
Battery Remaining Percentage: 95 %
Brady Statistic RA Percent Paced: 78 %
Brady Statistic RV Percent Paced: 0 %
Date Time Interrogation Session: 20240215081418
Implantable Lead Connection Status: 753985
Implantable Lead Connection Status: 753985
Implantable Lead Implant Date: 20230518
Implantable Lead Implant Date: 20230518
Implantable Lead Location: 753859
Implantable Lead Location: 753860
Implantable Lead Model: 377171
Implantable Lead Model: 377171
Implantable Lead Serial Number: 8000862722
Implantable Lead Serial Number: 8000872742
Implantable Pulse Generator Implant Date: 20230518
Lead Channel Impedance Value: 605 Ohm
Lead Channel Impedance Value: 624 Ohm
Lead Channel Pacing Threshold Amplitude: 0.8 V
Lead Channel Pacing Threshold Pulse Width: 0.4 ms
Lead Channel Sensing Intrinsic Amplitude: 2.9 mV
Lead Channel Sensing Intrinsic Amplitude: 7.6 mV
Lead Channel Setting Pacing Amplitude: 3 V
Lead Channel Setting Pacing Amplitude: 3 V
Lead Channel Setting Pacing Pulse Width: 0.4 ms
Pulse Gen Model: 407145
Pulse Gen Serial Number: 70417017

## 2023-02-01 DIAGNOSIS — R001 Bradycardia, unspecified: Secondary | ICD-10-CM | POA: Diagnosis not present

## 2023-02-02 NOTE — Progress Notes (Signed)
Remote pacemaker transmission.   

## 2023-02-12 DIAGNOSIS — I495 Sick sinus syndrome: Secondary | ICD-10-CM | POA: Diagnosis not present

## 2023-02-24 DIAGNOSIS — M25559 Pain in unspecified hip: Secondary | ICD-10-CM | POA: Diagnosis not present

## 2023-02-24 DIAGNOSIS — Z0000 Encounter for general adult medical examination without abnormal findings: Secondary | ICD-10-CM | POA: Diagnosis not present

## 2023-02-24 DIAGNOSIS — Z9181 History of falling: Secondary | ICD-10-CM | POA: Diagnosis not present

## 2023-02-24 DIAGNOSIS — I10 Essential (primary) hypertension: Secondary | ICD-10-CM | POA: Diagnosis not present

## 2023-02-24 DIAGNOSIS — Z1211 Encounter for screening for malignant neoplasm of colon: Secondary | ICD-10-CM | POA: Diagnosis not present

## 2023-02-24 DIAGNOSIS — Z8546 Personal history of malignant neoplasm of prostate: Secondary | ICD-10-CM | POA: Diagnosis not present

## 2023-02-24 DIAGNOSIS — Z1331 Encounter for screening for depression: Secondary | ICD-10-CM | POA: Diagnosis not present

## 2023-02-24 DIAGNOSIS — Z136 Encounter for screening for cardiovascular disorders: Secondary | ICD-10-CM | POA: Diagnosis not present

## 2023-02-24 DIAGNOSIS — E785 Hyperlipidemia, unspecified: Secondary | ICD-10-CM | POA: Diagnosis not present

## 2023-02-24 DIAGNOSIS — Z7189 Other specified counseling: Secondary | ICD-10-CM | POA: Diagnosis not present

## 2023-02-26 DIAGNOSIS — Z79899 Other long term (current) drug therapy: Secondary | ICD-10-CM | POA: Diagnosis not present

## 2023-03-02 DIAGNOSIS — K5730 Diverticulosis of large intestine without perforation or abscess without bleeding: Secondary | ICD-10-CM | POA: Diagnosis not present

## 2023-03-02 DIAGNOSIS — R195 Other fecal abnormalities: Secondary | ICD-10-CM | POA: Diagnosis not present

## 2023-03-02 DIAGNOSIS — K3189 Other diseases of stomach and duodenum: Secondary | ICD-10-CM | POA: Diagnosis not present

## 2023-03-02 DIAGNOSIS — Z8719 Personal history of other diseases of the digestive system: Secondary | ICD-10-CM | POA: Diagnosis not present

## 2023-03-02 DIAGNOSIS — I495 Sick sinus syndrome: Secondary | ICD-10-CM | POA: Diagnosis not present

## 2023-03-02 DIAGNOSIS — K2950 Unspecified chronic gastritis without bleeding: Secondary | ICD-10-CM | POA: Diagnosis not present

## 2023-03-02 DIAGNOSIS — I10 Essential (primary) hypertension: Secondary | ICD-10-CM | POA: Diagnosis not present

## 2023-03-02 DIAGNOSIS — Z950 Presence of cardiac pacemaker: Secondary | ICD-10-CM | POA: Diagnosis not present

## 2023-03-11 DIAGNOSIS — R7303 Prediabetes: Secondary | ICD-10-CM | POA: Diagnosis not present

## 2023-04-15 DIAGNOSIS — R195 Other fecal abnormalities: Secondary | ICD-10-CM | POA: Diagnosis not present

## 2023-04-27 DIAGNOSIS — C61 Malignant neoplasm of prostate: Secondary | ICD-10-CM | POA: Diagnosis not present

## 2023-04-27 DIAGNOSIS — Z125 Encounter for screening for malignant neoplasm of prostate: Secondary | ICD-10-CM | POA: Diagnosis not present

## 2023-05-05 DIAGNOSIS — H543 Unqualified visual loss, both eyes: Secondary | ICD-10-CM | POA: Diagnosis not present

## 2023-05-05 DIAGNOSIS — H04123 Dry eye syndrome of bilateral lacrimal glands: Secondary | ICD-10-CM | POA: Diagnosis not present

## 2023-05-05 DIAGNOSIS — H524 Presbyopia: Secondary | ICD-10-CM | POA: Diagnosis not present

## 2023-05-05 DIAGNOSIS — H539 Unspecified visual disturbance: Secondary | ICD-10-CM | POA: Diagnosis not present

## 2023-05-26 DIAGNOSIS — M5459 Other low back pain: Secondary | ICD-10-CM | POA: Diagnosis not present

## 2023-05-26 DIAGNOSIS — M5136 Other intervertebral disc degeneration, lumbar region: Secondary | ICD-10-CM | POA: Diagnosis not present

## 2023-05-26 DIAGNOSIS — M1611 Unilateral primary osteoarthritis, right hip: Secondary | ICD-10-CM | POA: Diagnosis not present

## 2023-06-18 ENCOUNTER — Telehealth: Payer: Self-pay | Admitting: Internal Medicine

## 2023-06-18 NOTE — Telephone Encounter (Signed)
Patient released in [facility] Monitoring

## 2023-06-18 NOTE — Telephone Encounter (Signed)
Office calling in regards to pt's home monitoring to be released, being that pt is now established with them. She would like a callback regarding this matter. Please advise

## 2023-06-21 DIAGNOSIS — D235 Other benign neoplasm of skin of trunk: Secondary | ICD-10-CM | POA: Diagnosis not present

## 2023-06-21 DIAGNOSIS — B351 Tinea unguium: Secondary | ICD-10-CM | POA: Diagnosis not present

## 2023-06-21 DIAGNOSIS — Z85828 Personal history of other malignant neoplasm of skin: Secondary | ICD-10-CM | POA: Diagnosis not present

## 2023-06-21 DIAGNOSIS — L570 Actinic keratosis: Secondary | ICD-10-CM | POA: Diagnosis not present

## 2023-06-21 DIAGNOSIS — D485 Neoplasm of uncertain behavior of skin: Secondary | ICD-10-CM | POA: Diagnosis not present

## 2023-06-21 DIAGNOSIS — L821 Other seborrheic keratosis: Secondary | ICD-10-CM | POA: Diagnosis not present

## 2023-06-21 DIAGNOSIS — Z08 Encounter for follow-up examination after completed treatment for malignant neoplasm: Secondary | ICD-10-CM | POA: Diagnosis not present

## 2023-06-21 DIAGNOSIS — I495 Sick sinus syndrome: Secondary | ICD-10-CM | POA: Diagnosis not present

## 2023-06-25 DIAGNOSIS — I495 Sick sinus syndrome: Secondary | ICD-10-CM | POA: Diagnosis not present

## 2023-06-29 DIAGNOSIS — M1611 Unilateral primary osteoarthritis, right hip: Secondary | ICD-10-CM | POA: Diagnosis not present

## 2023-06-29 DIAGNOSIS — Z981 Arthrodesis status: Secondary | ICD-10-CM | POA: Diagnosis not present

## 2023-06-29 DIAGNOSIS — Z950 Presence of cardiac pacemaker: Secondary | ICD-10-CM | POA: Diagnosis not present

## 2023-06-29 DIAGNOSIS — M25851 Other specified joint disorders, right hip: Secondary | ICD-10-CM | POA: Diagnosis not present

## 2023-06-29 DIAGNOSIS — M85451 Solitary bone cyst, right pelvis: Secondary | ICD-10-CM | POA: Diagnosis not present

## 2023-06-29 DIAGNOSIS — M5187 Other intervertebral disc disorders, lumbosacral region: Secondary | ICD-10-CM | POA: Diagnosis not present

## 2023-06-29 DIAGNOSIS — M5136 Other intervertebral disc degeneration, lumbar region: Secondary | ICD-10-CM | POA: Diagnosis not present

## 2023-06-29 DIAGNOSIS — M47817 Spondylosis without myelopathy or radiculopathy, lumbosacral region: Secondary | ICD-10-CM | POA: Diagnosis not present

## 2023-06-29 DIAGNOSIS — N281 Cyst of kidney, acquired: Secondary | ICD-10-CM | POA: Diagnosis not present

## 2023-06-29 DIAGNOSIS — M5186 Other intervertebral disc disorders, lumbar region: Secondary | ICD-10-CM | POA: Diagnosis not present

## 2023-06-29 DIAGNOSIS — M47816 Spondylosis without myelopathy or radiculopathy, lumbar region: Secondary | ICD-10-CM | POA: Diagnosis not present

## 2023-06-29 DIAGNOSIS — M4807 Spinal stenosis, lumbosacral region: Secondary | ICD-10-CM | POA: Diagnosis not present

## 2023-07-08 DIAGNOSIS — M5136 Other intervertebral disc degeneration, lumbar region: Secondary | ICD-10-CM | POA: Diagnosis not present

## 2023-08-03 DIAGNOSIS — M461 Sacroiliitis, not elsewhere classified: Secondary | ICD-10-CM | POA: Diagnosis not present

## 2023-08-17 DIAGNOSIS — M461 Sacroiliitis, not elsewhere classified: Secondary | ICD-10-CM | POA: Diagnosis not present

## 2023-08-25 DIAGNOSIS — N281 Cyst of kidney, acquired: Secondary | ICD-10-CM | POA: Diagnosis not present

## 2023-08-25 DIAGNOSIS — C61 Malignant neoplasm of prostate: Secondary | ICD-10-CM | POA: Diagnosis not present

## 2023-08-31 DIAGNOSIS — R7303 Prediabetes: Secondary | ICD-10-CM | POA: Diagnosis not present

## 2023-08-31 DIAGNOSIS — Z79899 Other long term (current) drug therapy: Secondary | ICD-10-CM | POA: Diagnosis not present

## 2023-08-31 DIAGNOSIS — Z23 Encounter for immunization: Secondary | ICD-10-CM | POA: Diagnosis not present

## 2023-08-31 DIAGNOSIS — I10 Essential (primary) hypertension: Secondary | ICD-10-CM | POA: Diagnosis not present

## 2023-09-01 DIAGNOSIS — M47816 Spondylosis without myelopathy or radiculopathy, lumbar region: Secondary | ICD-10-CM | POA: Diagnosis not present

## 2023-09-01 DIAGNOSIS — M461 Sacroiliitis, not elsewhere classified: Secondary | ICD-10-CM | POA: Diagnosis not present

## 2023-09-01 DIAGNOSIS — Z981 Arthrodesis status: Secondary | ICD-10-CM | POA: Diagnosis not present

## 2023-09-06 DIAGNOSIS — M461 Sacroiliitis, not elsewhere classified: Secondary | ICD-10-CM | POA: Diagnosis not present

## 2023-09-06 DIAGNOSIS — R29898 Other symptoms and signs involving the musculoskeletal system: Secondary | ICD-10-CM | POA: Diagnosis not present

## 2023-09-06 DIAGNOSIS — L570 Actinic keratosis: Secondary | ICD-10-CM | POA: Diagnosis not present

## 2023-09-21 DIAGNOSIS — I495 Sick sinus syndrome: Secondary | ICD-10-CM | POA: Diagnosis not present

## 2023-11-18 DIAGNOSIS — M461 Sacroiliitis, not elsewhere classified: Secondary | ICD-10-CM | POA: Diagnosis not present

## 2023-11-23 DIAGNOSIS — N281 Cyst of kidney, acquired: Secondary | ICD-10-CM | POA: Diagnosis not present

## 2024-10-24 ENCOUNTER — Telehealth: Payer: Self-pay | Admitting: Internal Medicine

## 2024-10-24 NOTE — Telephone Encounter (Signed)
 Patient needs an apt with EP MD before we can take back pacemaker monitoring. Last OV was 2023. w/ [facility].

## 2024-10-24 NOTE — Telephone Encounter (Signed)
  1. Has your device fired? no  2. Is you device beeping? no  3. Are you experiencing draining or swelling at device site? no  4. Are you calling to see if we received your device transmission? no  5. Have you passed out? No  Patient has moved back here and need us  to start monitoring his pace maker. Please advise.   Please route to Device Clinic Pool

## 2024-11-17 NOTE — Telephone Encounter (Signed)
 Spoke w/ patient - he is scheduled to see Dr. Inocencio on 1/14 to establish care for PPM from Dr. Waddell.

## 2024-12-06 ENCOUNTER — Encounter: Payer: Self-pay | Admitting: Cardiology

## 2024-12-06 ENCOUNTER — Ambulatory Visit: Attending: Cardiology | Admitting: Cardiology

## 2024-12-06 VITALS — BP 128/80 | HR 60 | Ht 71.0 in | Wt 134.8 lb

## 2024-12-06 DIAGNOSIS — I495 Sick sinus syndrome: Secondary | ICD-10-CM | POA: Diagnosis not present

## 2024-12-06 DIAGNOSIS — R001 Bradycardia, unspecified: Secondary | ICD-10-CM | POA: Diagnosis not present

## 2024-12-06 DIAGNOSIS — I10 Essential (primary) hypertension: Secondary | ICD-10-CM | POA: Diagnosis not present

## 2024-12-06 NOTE — Progress Notes (Signed)
" °  Electrophysiology Office Note:   Date:  12/06/2024  ID:  Kelon, Easom Dec 22, 1945, MRN [id number]  Primary Cardiologist: Lonni Cash, MD Primary Heart Failure: None Electrophysiologist: None      History of Present Illness:   OGLE HOEFFNER is a 79 y.o. male with h/o hypertension, hyperlipidemia, mitral valve prolapse, sinus node dysfunction seen today for routine electrophysiology followup.   Discussed the use of AI scribe software for clinical note transcription with the patient, who gave verbal consent to proceed.  History of Present Illness DAMARIA STOFKO is a 79 year old male with a history of pacemaker implantation who presents for a follow-up visit. He was referred by his primary care physician due to a significantly low pulse rate.  He initially had a pacemaker implanted after experiencing significant weakness following back surgery, which he initially attributed to the surgery. 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 This led to a referral to a cardiologist and subsequent pacemaker implantation.  Prior to the pacemaker implantation, a heart monitor recorded his pulse dropping to the thirties. Since the implantation, he notes a significant improvement in his condition.  He describes a challenging period over the last six months due to personal circumstances, but he feels that his heart condition is stable. He currently resides in [place] after moving back from [place], where he lived for two years.  he denies chest pain, palpitations, dyspnea, PND, orthopnea, nausea, vomiting, dizziness, syncope, edema, weight gain, or early satiety.   Review of systems complete and found to be negative unless listed in HPI.      EP Information / Studies Reviewed:    EKG is ordered today. Personal review as below.  EKG Interpretation Date/Time:  Wednesday December 06 2024 16:11:22  EST Ventricular Rate:  60 PR Interval:  184 QRS Duration:  92 QT Interval:  404 QTC Calculation: 404 R Axis:   95  Text Interpretation: Atrial-paced rhythm Rightward axis Nonspecific ST and T wave abnormality When compared with ECG of 09-Apr-2022 15:20, ST now depressed in Inferior leads T wave inversion now evident in Inferior leads Confirmed by Aleshia Cartelli ([id number]) on 12/06/2024 4:24:12 PM   PPM Interrogation-  reviewed in detail today,  See PACEART report.  Device History: Biotronik Dual Chamber PPM implanted 04/09/2022 for Sinus Node Dysfunction  Risk Assessment/Calculations:            Physical Exam:   VS:  BP 128/80 (BP Location: Left Arm, Patient Position: Sitting, Cuff Size: Normal)   Pulse 60   Ht 5' 11 (1.803 m)   Wt 134 lb 12.8 oz (61.1 kg)   SpO2 96%   BMI 18.80 kg/m    Wt Readings from Last 3 Encounters:  12/06/24 134 lb 12.8 oz (61.1 kg)  07/13/22 150 lb (68 kg)  04/09/22 154 lb (69.9 kg)     GEN: Well nourished, well developed in no acute distress NECK: No JVD; No carotid bruits CARDIAC: Regular rate and rhythm, no murmurs, rubs, gallops RESPIRATORY:  Clear to auscultation without rales, wheezing or rhonchi  ABDOMEN: Soft, non-tender, non-distended EXTREMITIES:  No edema; No deformity   ASSESSMENT AND PLAN:    SND s/p Biotronik PPM  Normal PPM function See Pace Art report No changes today  2.  Hypertension: Well-controlled  Disposition:   Follow up with EP Team in 12 months  Signed, Jasraj Lappe Gladis Norton, MD  "

## 2024-12-15 ENCOUNTER — Ambulatory Visit: Payer: Self-pay | Admitting: Cardiology

## 2024-12-15 ENCOUNTER — Telehealth: Payer: Self-pay

## 2024-12-15 LAB — CUP PACEART INCLINIC DEVICE CHECK
Battery Remaining Percentage: 80 %
Brady Statistic RA Percent Paced: 77 %
Brady Statistic RV Percent Paced: 0 %
Date Time Interrogation Session: 20260114160851
Implantable Lead Connection Status: 753985
Implantable Lead Connection Status: 753985
Implantable Lead Implant Date: 20230518
Implantable Lead Implant Date: 20230518
Implantable Lead Location: 753859
Implantable Lead Location: 753860
Implantable Lead Model: 377171
Implantable Lead Model: 377171
Implantable Lead Serial Number: 8000862722
Implantable Lead Serial Number: 8000872742
Implantable Pulse Generator Implant Date: 20230518
Lead Channel Impedance Value: 604 Ohm
Lead Channel Impedance Value: 604 Ohm
Lead Channel Pacing Threshold Amplitude: 0.9 V
Lead Channel Pacing Threshold Amplitude: 0.9 V
Lead Channel Pacing Threshold Pulse Width: 0.4 ms
Lead Channel Pacing Threshold Pulse Width: 0.4 ms
Lead Channel Setting Pacing Amplitude: 1.4 V
Lead Channel Setting Pacing Amplitude: 2 V
Lead Channel Setting Pacing Pulse Width: 0.4 ms
Pulse Gen Model: 407145
Pulse Gen Serial Number: 70417017

## 2024-12-15 NOTE — Telephone Encounter (Signed)
 I have put in a message request with [facility]:  [phone] Wenona Roys or Alan Rhein)  To please release patient for monitoring.   Currently he is in our deactivated pool in [place].  We cannot re-activate or schedule his remote monitoring until [facility] releases him.  He re-established with [facility] on 12/06/24.    Device clinic number given to return call.  Patient will need remote appts scheduled after release/reactivation.

## 2024-12-21 NOTE — Telephone Encounter (Signed)
 Patient has officially been released.   Sent [facility] a my chart message to get him set up for remote monitoring.

## 2025-02-08 ENCOUNTER — Ambulatory Visit: Admitting: Cardiovascular Disease

## 2025-03-22 ENCOUNTER — Ambulatory Visit

## 2025-06-21 ENCOUNTER — Ambulatory Visit

## 2025-09-20 ENCOUNTER — Ambulatory Visit

## 2025-12-20 ENCOUNTER — Ambulatory Visit

## 2026-03-21 ENCOUNTER — Ambulatory Visit
